# Patient Record
Sex: Female | Born: 1985
Health system: Southern US, Community
[De-identification: ages and names within clinical notes are randomized; demographics above are authoritative.]

## PROBLEM LIST (undated history)

## (undated) DIAGNOSIS — I82409 Acute embolism and thrombosis of unspecified deep veins of unspecified lower extremity: Secondary | ICD-10-CM

## (undated) DIAGNOSIS — D270 Benign neoplasm of right ovary: Secondary | ICD-10-CM

## (undated) DIAGNOSIS — F419 Anxiety disorder, unspecified: Secondary | ICD-10-CM

## (undated) DIAGNOSIS — B009 Herpesviral infection, unspecified: Secondary | ICD-10-CM

## (undated) DIAGNOSIS — D6851 Activated protein C resistance: Secondary | ICD-10-CM

## (undated) DIAGNOSIS — N946 Dysmenorrhea, unspecified: Secondary | ICD-10-CM

## (undated) HISTORY — DX: Anxiety disorder, unspecified: F41.9

## (undated) HISTORY — DX: Activated protein C resistance: D68.51

## (undated) HISTORY — DX: Dysmenorrhea, unspecified: N94.6

## (undated) HISTORY — DX: Acute embolism and thrombosis of unspecified deep veins of unspecified lower extremity: I82.409

## (undated) HISTORY — DX: Herpesviral infection, unspecified: B00.9

## (undated) HISTORY — DX: Benign neoplasm of right ovary: D27.0

---
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
## 2007-09-11 ENCOUNTER — Encounter: Admission: RE | Admit: 2007-09-11 | Discharge: 2007-09-11 | Payer: Self-pay | Admitting: *Deleted

## 2007-12-11 ENCOUNTER — Other Ambulatory Visit: Admission: RE | Admit: 2007-12-11 | Discharge: 2007-12-11 | Payer: Self-pay | Admitting: Gynecology

## 2008-01-10 ENCOUNTER — Encounter: Admission: RE | Admit: 2008-01-10 | Discharge: 2008-01-10 | Payer: Self-pay | Admitting: Surgery

## 2008-08-11 ENCOUNTER — Ambulatory Visit: Payer: Self-pay | Admitting: Oncology

## 2008-08-25 LAB — CBC WITH DIFFERENTIAL/PLATELET
BASO%: 0.6 % (ref 0.0–2.0)
EOS%: 1.2 % (ref 0.0–7.0)
LYMPH%: 33.3 % (ref 14.0–48.0)
MCH: 30.7 pg (ref 26.0–34.0)
MCHC: 34.9 g/dL (ref 32.0–36.0)
MCV: 88.1 fL (ref 81.0–101.0)
MONO#: 0.4 10*3/uL (ref 0.1–0.9)
MONO%: 5.6 % (ref 0.0–13.0)
NEUT%: 59.3 % (ref 39.6–76.8)
Platelets: 260 10*3/uL (ref 145–400)
RBC: 4.57 10*6/uL (ref 3.70–5.32)
WBC: 7.8 10*3/uL (ref 3.9–10.0)

## 2008-08-25 LAB — URINALYSIS, MICROSCOPIC - CHCC
Bilirubin (Urine): NEGATIVE
Ketones: NEGATIVE mg/dL
Specific Gravity, Urine: 1.015 (ref 1.003–1.035)

## 2008-08-25 LAB — MORPHOLOGY

## 2008-09-03 LAB — COMPREHENSIVE METABOLIC PANEL
Albumin: 4.7 g/dL (ref 3.5–5.2)
BUN: 13 mg/dL (ref 6–23)
CO2: 20 mEq/L (ref 19–32)
Calcium: 9.7 mg/dL (ref 8.4–10.5)
Chloride: 106 mEq/L (ref 96–112)
Creatinine, Ser: 0.66 mg/dL (ref 0.40–1.20)
Potassium: 4 mEq/L (ref 3.5–5.3)

## 2008-09-03 LAB — LACTATE DEHYDROGENASE: LDH: 117 U/L (ref 94–250)

## 2008-09-03 LAB — COLD AGGLUTININS: Cold Agglutinin Titer: <1:32 {titer}

## 2008-09-03 LAB — ANA: Anti Nuclear Antibody(ANA): NEGATIVE

## 2008-09-03 LAB — ANTI-DNA ANTIBODY, DOUBLE-STRANDED: ds DNA Ab: 1 IU/mL (ref <5)

## 2008-09-03 LAB — DIRECT ANTIGLOBULIN TEST (NOT AT ARMC): DAT (Complement): NEGATIVE

## 2009-03-01 ENCOUNTER — Ambulatory Visit (HOSPITAL_BASED_OUTPATIENT_CLINIC_OR_DEPARTMENT_OTHER): Admission: RE | Admit: 2009-03-01 | Discharge: 2009-03-02 | Payer: Self-pay | Admitting: Orthopedic Surgery

## 2009-03-03 ENCOUNTER — Ambulatory Visit: Payer: Self-pay | Admitting: Oncology

## 2009-03-03 LAB — PROTIME-INR: Protime: 14.4 Seconds — ABNORMAL HIGH (ref 10.6–13.4)

## 2009-03-05 LAB — PROTIME-INR
INR: 1.4 — ABNORMAL LOW (ref 2.00–3.50)
Protime: 16.8 Seconds — ABNORMAL HIGH (ref 10.6–13.4)

## 2009-03-09 LAB — PROTIME-INR: Protime: 16.8 Seconds — ABNORMAL HIGH (ref 10.6–13.4)

## 2009-03-16 LAB — PROTIME-INR: INR: 1.8 — ABNORMAL LOW (ref 2.00–3.50)

## 2009-04-09 ENCOUNTER — Ambulatory Visit: Payer: Self-pay | Admitting: Oncology

## 2009-07-31 HISTORY — PX: OTHER SURGICAL HISTORY: SHX169

## 2010-07-31 HISTORY — PX: WISDOM TOOTH EXTRACTION: SHX21

## 2010-11-05 LAB — POCT HEMOGLOBIN-HEMACUE: Hemoglobin: 14.6 g/dL (ref 12.0–15.0)

## 2010-12-13 NOTE — Op Note (Signed)
Julie Doyle, Julie Doyle              ACCOUNT NO.:  [id number]   MEDICAL RECORD NO.:  [id number]          PATIENT TYPE:  AMB   LOCATION:  DSC                          FACILITY:  [facility]   PHYSICIAN:  Robert A. Thurston Hole, M.D. DATE OF BIRTH:  25-Sep-1985   DATE OF PROCEDURE:  03/01/2009  DATE OF DISCHARGE:                               OPERATIVE REPORT   PREOPERATIVE DIAGNOSES:  1. Right ankle fibular fracture.  2. Right ankle syndesmosis disruption.   POSTOPERATIVE DIAGNOSES:  1. Right ankle fibular fracture.  2. Right ankle syndesmosis disruption.   PROCEDURE:  1. Open reduction and internal fixation of right ankle distal fibular      fracture.  2. Right ankle syndesmosis fixation.   SURGEON:  Elana Alm. Thurston Hole, MD   ASSISTANT:  Julien Girt, PA   ANESTHESIA:  General.   OPERATIVE TIME:  1 hour and 15 minutes.   COMPLICATIONS:  None.   INDICATIONS FOR PROCEDURE:  Julie Doyle is a 25 year old who sustained a  twisting torquing injury to her right ankle approximately 1 week ago  where x-rays and  examination have revealed a displaced distal fibular  fracture with syndesmosis disruption.  She is now to undergo ORIF of  this.  Of note is that she has a factor V Leiden deficiency and she has  been on preoperative Lovenox and will be treated postoperatively with  Lovenox and Coumadin as well.   DESCRIPTION:  Julie Doyle was brought to the operating room on March 01, 2009, placed on the operative table in supine position.  After an  adequate level of general anesthesia was obtained, her right foot and  leg was prepped using sterile DuraPrep and draped using sterile  technique.  She received Ancef 1 g IV preoperatively for prophylaxis.  The right foot and leg was prepped using sterile DuraPrep and draped  using sterile technique.  Foot and leg were exsanguinated and a calf  tourniquet elevated to 250 mm.  Initially through a 5-cm longitudinal  incision based over the distal fibula,  initial exposure was made.  The  underlying subcutaneous tissues were incised along with skin incision.  The peroneal tendons and sural nerve were carefully protected while the  fracture was exposed.  Hematoma was removed from around the fracture  site and the fracture was reduced in an anatomic position and then an  anterior to posterior 3.5 mm lag screw was placed using standard AO  technique.  This provisionally fixed the fracture in an anatomically  reduced position.  At this point, a 6-hole one-third tubular plate was  placed on the lateral surface.  The 2 most distal screw holes drilled,  measured, and tapped and appropriate length 4.0 mm screws placed and the  2 most proximal screw holes drilled, measured, and tapped and the  appropriate length 3.5-mm cortical screws placed.  After this was done,  then intraoperative fluoroscopy confirmed that there was still some  widening of the syndesmosis and thus a TightRope syndesmosis fixation  apparatus was used through the fibular plate across the syndesmosis.  The syndesmosis was held in a reduced and in  anatomic position with the  ankle in neutral while the TightRope was then tightened and this gave  excellent fixation.  At this point, it was felt that all pathology had  been satisfactorily addressed.  AP and lateral fluoroscopic x-rays  confirmed anatomic reduction of the fracture, anatomic position of the  hardware, and anatomic reduction of the syndesmosis.  The wound was then  irrigated and closed with 2-0 Vicryl to close the fascia over the  fibular plate.  Subcutaneous tissues were closed with 2-0 Vicryl and  subcuticular layer closed with 4-0 Monocryl.  Sterile dressings and a  short-leg splint applied.  Tourniquet was released and the patient  awakened, extubated, and taken to recovery room in stable condition.  Needle and sponge counts correct x2 at the end of the case.   FOLLOWUP CARE:  Julie Doyle will be followed overnight in the  [facility] for IV pain control and neurovascular monitoring.  She will start  on her Coumadin and then back on Coumadin tonight and Lovenox tomorrow.  She will be seen back in the office in a week for wound check and follow  up.      Robert A. Thurston Hole, M.D.  Electronically Signed     RAW/MEDQ  D:  03/01/2009  T:  03/02/2009  Job:  [id number]

## 2012-11-12 ENCOUNTER — Ambulatory Visit (INDEPENDENT_AMBULATORY_CARE_PROVIDER_SITE_OTHER): Payer: BC Managed Care – PPO | Admitting: Internal Medicine

## 2012-11-12 ENCOUNTER — Encounter: Payer: Self-pay | Admitting: Internal Medicine

## 2012-11-12 VITALS — BP 124/76 | HR 78 | Temp 99.0°F | Resp 18 | Wt 196.0 lb

## 2012-11-12 DIAGNOSIS — F329 Major depressive disorder, single episode, unspecified: Secondary | ICD-10-CM | POA: Insufficient documentation

## 2012-11-12 DIAGNOSIS — F341 Dysthymic disorder: Secondary | ICD-10-CM

## 2012-11-12 DIAGNOSIS — N946 Dysmenorrhea, unspecified: Secondary | ICD-10-CM | POA: Insufficient documentation

## 2012-11-12 DIAGNOSIS — S0300XA Dislocation of jaw, unspecified side, initial encounter: Secondary | ICD-10-CM | POA: Insufficient documentation

## 2012-11-12 DIAGNOSIS — F411 Generalized anxiety disorder: Secondary | ICD-10-CM | POA: Insufficient documentation

## 2012-11-12 DIAGNOSIS — D6859 Other primary thrombophilia: Secondary | ICD-10-CM

## 2012-11-12 DIAGNOSIS — G4700 Insomnia, unspecified: Secondary | ICD-10-CM

## 2012-11-12 DIAGNOSIS — D6851 Activated protein C resistance: Secondary | ICD-10-CM | POA: Insufficient documentation

## 2012-11-12 LAB — CBC WITH DIFFERENTIAL/PLATELET
Basophils Relative: 1 % (ref 0–1)
HCT: 39.7 % (ref 36.0–46.0)
Hemoglobin: 13.6 g/dL (ref 12.0–15.0)
Lymphs Abs: 2.9 10*3/uL (ref 0.7–4.0)
MCH: 29.4 pg (ref 26.0–34.0)
MCHC: 34.3 g/dL (ref 30.0–36.0)
Monocytes Absolute: 0.6 10*3/uL (ref 0.1–1.0)
Monocytes Relative: 6 % (ref 3–12)
Neutro Abs: 5.2 10*3/uL (ref 1.7–7.7)
Neutrophils Relative %: 57 % (ref 43–77)
RBC: 4.63 MIL/uL (ref 3.87–5.11)

## 2012-11-12 LAB — LIPID PANEL
Cholesterol: 169 mg/dL (ref 0–200)
LDL Cholesterol: 89 mg/dL (ref 0–99)
Total CHOL/HDL Ratio: 3 Ratio
Triglycerides: 115 mg/dL (ref <150)
VLDL: 23 mg/dL (ref 0–40)

## 2012-11-12 MED ORDER — CLONAZEPAM 1 MG PO TABS: ORAL_TABLET | ORAL | Status: DC

## 2012-11-12 NOTE — Patient Instructions (Addendum)
See me in 4 weeks 

## 2012-11-12 NOTE — Progress Notes (Signed)
Subjective:    Patient ID: Julie Doyle, female    DOB: [birth year], 27 y.o.   MRN: [id number]  HPI Julie Doyle is a new pt here for first visit to establish care.   Currently working and living in [place] until apartment lease expires in August.    PMH of anxiety treated with Clonazepam in the past,  Situational depression treated with Zoloft in the past -( off Zoloft for approx 1.5 years now ),  Factor V leiden,  Dysmenorrhea and ORIF repair of R fibula in 2011.  Julie Doyle also reports she had ADD diagnosed as a child but is very intolerant of stimulants.    Julie Doyle describes an episode 5 days ago where she had L arm pain and some dizzyness awakening her from sleep last Thursday.  She was evaluated at [facility] in [place].  She was concerned she may have blood clot in her L arm and reports the upper arm lultrasound was negative along with V/Q scan and CXR.   She was assured there was no clot.  She denies pain or edema in either arm.  She is also having insomnia.  She is in a relationship with boyfriend that she is trying to end but "he keeps coming around"  She describes almost stalking type behavior and boyfriend has threatened suicide attempts in past.  She knows she is anxious and is having trouble sleeping.  Clonazepam worked well for her anxiety in the past.  She was on Zoloft temporarily when working at [employer] but now has been off anti-depressant for over a year.  She denies S/H ideation,   Does not describe sadness or anhedonia  .  She has no psychotic features.  She has had a therapist off and on but none in [facility]  Allergies  Allergen Reactions  . Ivp Dye (Iodinated Diagnostic Agents)    History reviewed. No pertinent past medical history. History reviewed. No pertinent past surgical history. History   Social History  . Marital Status: Single    Spouse Name: N/A    Number of Children: N/A  . Years of Education: N/A   Occupational History  . Not on file.   Social History  Main Topics  . Smoking status: Not on file  . Smokeless tobacco: Not on file  . Alcohol Use: Not on file  . Drug Use: Not on file  . Sexually Active: Not on file   Other Topics Concern  . Not on file   Social History Narrative  . No narrative on file   History reviewed. No pertinent family history. Patient Active Problem List  Diagnosis  . Factor 5 Leiden mutation, heterozygous   No current outpatient prescriptions on file prior to visit.   No current facility-administered medications on file prior to visit.      Review of Systems See HPI    Objective:   Physical Exam Physical Exam  Nursing note and vitals reviewed.  Constitutional: She is oriented to person, place, and time. She appears well-developed and well-nourished.  HENT:  Head: Normocephalic and atraumatic.  Cardiovascular: Normal rate and regular rhythm. Exam reveals no gallop and no friction rub.  No murmur heard.  Pulmonary/Chest: Breath sounds normal. She has no wheezes. She has no rales.  Neurological: She is alert and oriented to person, place, and time.  Skin: Skin is warm and dry.  Psychiatric: She has a normal mood and affect. Her behavior is normal.  Assessment & Plan:  Situational anxiety  Will give low dose Clonazepam as this has worked well in thepast   She is to take 1/2 or one whole pill at night and 1/2 in daytime 12 hours later if needed.    Check TSH and labs today.  She is contemplating moving to [place] later in summer and I recommended therapist if she resides here.    History of Factor V leiden  dysmennorrhea  NSAID of choice prior to menses  L arm pain  Will get records from [facility]  History of childhood ADD  See me in 4 weeks or sooner as needed

## 2012-11-14 ENCOUNTER — Encounter: Payer: Self-pay | Admitting: *Deleted

## 2012-11-20 ENCOUNTER — Telehealth: Payer: Self-pay | Admitting: *Deleted

## 2012-11-20 NOTE — Telephone Encounter (Signed)
Message copied by Mathews Robinsons on Wed Nov 20, 2012  2:19 PM ------      Message from: Zola Button L      Created: Wed Nov 20, 2012  1:19 PM      Regarding: Pt phone call      Contact: [phone]       Pt called and left msg at 11:40 today states she has watery eyes swollen eyes and wants to know what the best course of treatment is for her to try.  Please call her back at [phone]            [facility] ------

## 2012-11-20 NOTE — Telephone Encounter (Signed)
Error

## 2012-11-20 NOTE — Telephone Encounter (Signed)
Advised Pt to use OTC allergy meds and eye drops. Advised pt that if sx worsened or she experienced swelling in her face or throat to seek emergency care

## 2012-12-12 ENCOUNTER — Telehealth: Payer: Self-pay | Admitting: *Deleted

## 2012-12-12 ENCOUNTER — Ambulatory Visit (INDEPENDENT_AMBULATORY_CARE_PROVIDER_SITE_OTHER): Payer: BC Managed Care – PPO | Admitting: Internal Medicine

## 2012-12-12 ENCOUNTER — Encounter: Payer: Self-pay | Admitting: Internal Medicine

## 2012-12-12 VITALS — BP 113/70 | HR 74 | Temp 98.7°F | Ht 65.25 in | Wt 202.0 lb

## 2012-12-12 DIAGNOSIS — D6851 Activated protein C resistance: Secondary | ICD-10-CM

## 2012-12-12 DIAGNOSIS — N946 Dysmenorrhea, unspecified: Secondary | ICD-10-CM

## 2012-12-12 DIAGNOSIS — F411 Generalized anxiety disorder: Secondary | ICD-10-CM

## 2012-12-12 DIAGNOSIS — D6859 Other primary thrombophilia: Secondary | ICD-10-CM

## 2012-12-12 MED ORDER — LORAZEPAM 0.5 MG PO TABS: ORAL_TABLET | ORAL | Status: DC

## 2012-12-12 MED ORDER — DIFLUNISAL 500 MG PO TABS: 500.0000 mg | ORAL_TABLET | Freq: Two times a day (BID) | ORAL | Status: DC

## 2012-12-12 MED ORDER — NYSTATIN-TRIAMCINOLONE 100000-0.1 UNIT/GM-% EX OINT: TOPICAL_OINTMENT | Freq: Two times a day (BID) | CUTANEOUS | Status: DC

## 2012-12-12 NOTE — Progress Notes (Signed)
Here for follow up. Reports she had a dream that she couldn't breathe and woke up gasping for breathe- states chest was sore for a week.

## 2012-12-12 NOTE — Progress Notes (Signed)
Subjective:    Patient ID: Julie Doyle, female    DOB: 1986-02-11, 27 y.o.   MRN: [id number]  HPI Julie Doyle is here for follow up.  She states her Klonopin is very helpful but tends to make her drowsy. Only uses occasionally at HS.    Former boyfriend bothering her less.  She changed her ringtone on her phone and this helps with her anxiety.  She does describe a bad dream she had 3-4 weeks ago and woke up breathing hard.   She had 1-2 days of chest discomfort around her ribs after that.  All symptoms resolved now.    She has longstanding dysmenorrhea.  Had tried Mirena in the past but was removed after 2-3 months due to pain.  Her GYN is [facility].   She has history of Factor V Leiden and does not wish to try OC"S  She has lots of itching under her breast .  She does wear a tight sports bra on occasion  Allergies  Allergen Reactions  . Ivp Dye (Iodinated Diagnostic Agents)    Past Medical History  Diagnosis Date  . Factor 5 Leiden mutation, heterozygous    Past Surgical History  Procedure Laterality Date  . Appendectomy    . Orif of right fib     History   Social History  . Marital Status: Single    Spouse Name: N/A    Number of Children: N/A  . Years of Education: N/A   Occupational History  . Not on file.   Social History Main Topics  . Smoking status: Never Smoker   . Smokeless tobacco: Never Used  . Alcohol Use: Yes     Comment: socially   . Drug Use: No  . Sexually Active: Yes    Birth Control/ Protection: Condom   Other Topics Concern  . Not on file   Social History Narrative  . No narrative on file   Family History  Problem Relation Age of Onset  . Hypertension Mother   . Cancer Maternal Grandmother     pancreatic  . Diabetes Maternal Grandmother   . Cancer Paternal Grandmother    Patient Active Problem List   Diagnosis Date Noted  . Anxiety state, unspecified 11/12/2012  . Reactive depression (situational) 11/12/2012  . Factor V Leiden  11/12/2012  . Dysmenorrhea 11/12/2012  . Insomnia 11/12/2012  . TMJ (dislocation of temporomandibular joint) 11/12/2012  . Factor 5 Leiden mutation, heterozygous    No current outpatient prescriptions on file prior to visit.   No current facility-administered medications on file prior to visit.       Review of Systems    see HPI Objective:   Physical Exam  Physical Exam  Nursing note and vitals reviewed.  Constitutional: She is oriented to person, place, and time. She appears well-developed and well-nourished.  HENT:  Head: Normocephalic and atraumatic.  Cardiovascular: Normal rate and regular rhythm. Exam reveals no gallop and no friction rub.  No murmur heard.  Pulmonary/Chest: Breath sounds normal. She has no wheezes. She has no rales.  Breast:  She has reddened rash under each breast Neurological: She is alert and oriented to person, place, and time.  Skin: Skin is warm and dry.  Psychiatric: She has a normal mood and affect. Her behavior is normal.            Assessment & Plan:  Anxiety  Will D/C Klonopin and change to shorter acting Ativan  1/2 tab during day and may  repeat 1/2 table 2-3 times per week at hs.    Dysmenorrhea:  Will try Dolobid 500 bid.   Unable to tolerate Mirena.   Advised pt to call [facility] for further evaluation and treatment  Tinea  OK for nystatin TAC creme  See me in 2 months or sooner prn

## 2012-12-12 NOTE — Patient Instructions (Addendum)
See me in 2 months 

## 2012-12-12 NOTE — Telephone Encounter (Signed)
Received a call from pharmacy re: mycolog cream . States price has gone up dramatically to $120 for combination cream- is cheaper to patient to provide 2 separate creams- nystatin and triaminocolone cream if ok with MD. Veirified with Dr. Constance Goltz - may fill individually in 2 creams.

## 2012-12-13 ENCOUNTER — Telehealth: Payer: Self-pay | Admitting: *Deleted

## 2012-12-13 NOTE — Telephone Encounter (Signed)
Returned pts call regarding medication questions LVM will attempt again before leaving office

## 2012-12-13 NOTE — Telephone Encounter (Signed)
Pt had a question regarding her dolobid pt states that after taking medication she began having swelling in her ankles and worsening cramps. Advised pt stop medication and resume taking Ibuprofen. Pt requesting hydrocodone

## 2012-12-16 ENCOUNTER — Telehealth: Payer: Self-pay | Admitting: *Deleted

## 2012-12-16 ENCOUNTER — Telehealth: Payer: Self-pay | Admitting: Internal Medicine

## 2012-12-16 DIAGNOSIS — R102 Pelvic and perineal pain: Secondary | ICD-10-CM

## 2012-12-16 NOTE — Telephone Encounter (Signed)
Spoke with pt.  She reports ankles were swelling when taking Dolobid.  And she continued to have severe pelvic pain  Will get pelvic ultrasound and see pt in my office in am. She voices understanding.  I counseled her again to make appt with her GYN at [facility]

## 2012-12-16 NOTE — Telephone Encounter (Signed)
Made pt aware of appt for [place] at 9:30 on 5/20 as well as begin drinking 32 oz of water one hour prior to procedure

## 2012-12-17 ENCOUNTER — Encounter: Payer: Self-pay | Admitting: Internal Medicine

## 2012-12-17 ENCOUNTER — Ambulatory Visit (HOSPITAL_BASED_OUTPATIENT_CLINIC_OR_DEPARTMENT_OTHER)
Admission: RE | Admit: 2012-12-17 | Discharge: 2012-12-17 | Disposition: A | Discharge status: Home / Self Care | Payer: BC Managed Care – PPO | Source: Ambulatory Visit | Attending: Internal Medicine | Admitting: Internal Medicine

## 2012-12-17 ENCOUNTER — Telehealth: Payer: Self-pay | Admitting: Internal Medicine

## 2012-12-17 ENCOUNTER — Ambulatory Visit (INDEPENDENT_AMBULATORY_CARE_PROVIDER_SITE_OTHER): Payer: BC Managed Care – PPO | Admitting: Internal Medicine

## 2012-12-17 VITALS — BP 110/67 | HR 72 | Temp 98.6°F | Resp 18 | Wt 198.0 lb

## 2012-12-17 DIAGNOSIS — N946 Dysmenorrhea, unspecified: Secondary | ICD-10-CM | POA: Insufficient documentation

## 2012-12-17 DIAGNOSIS — R102 Pelvic and perineal pain: Secondary | ICD-10-CM

## 2012-12-17 DIAGNOSIS — N949 Unspecified condition associated with female genital organs and menstrual cycle: Secondary | ICD-10-CM

## 2012-12-17 DIAGNOSIS — N9489 Other specified conditions associated with female genital organs and menstrual cycle: Secondary | ICD-10-CM

## 2012-12-17 DIAGNOSIS — D279 Benign neoplasm of unspecified ovary: Secondary | ICD-10-CM | POA: Insufficient documentation

## 2012-12-17 DIAGNOSIS — D6859 Other primary thrombophilia: Secondary | ICD-10-CM | POA: Insufficient documentation

## 2012-12-17 DIAGNOSIS — N83209 Unspecified ovarian cyst, unspecified side: Secondary | ICD-10-CM | POA: Insufficient documentation

## 2012-12-17 MED ORDER — HYDROCODONE-ACETAMINOPHEN 5-325 MG PO TABS: ORAL_TABLET | ORAL | Status: DC

## 2012-12-17 NOTE — Telephone Encounter (Signed)
Pt scheduled with Dr Farrel Gobble Friday Dec 27, 2012 at 12:15 pm.  Pt made aware of appt, date, time & location by cell phone voicemail.Julie Doyle

## 2012-12-17 NOTE — Patient Instructions (Addendum)
Will refer to GYN  Take pain med as needed

## 2012-12-17 NOTE — Progress Notes (Signed)
Subjective:    Patient ID: Julie Doyle, female    DOB: 25-May-1986, 25 y.o.   MRN: [id number]  HPI Julie Doyle is here for follow up of pelvic pain and dysmenorrhea.  See ultrasound.  She has approx 6 cm adnexal cyst with possible solid component on imaging.   Dysmenorrhea long-standing.  She was unable to tolerate Mirena she has had in the past.  Mother has Factor V Leiden and pt is too fearful of OC's  Pain is more concentrated on her R side -intermittant.    Allergies  Allergen Reactions  . Ivp Dye (Iodinated Diagnostic Agents)    Past Medical History  Diagnosis Date  . Factor 5 Leiden mutation, heterozygous    Past Surgical History  Procedure Laterality Date  . Appendectomy    . Orif of right fib     History   Social History  . Marital Status: Single    Spouse Name: N/A    Number of Children: N/A  . Years of Education: N/A   Occupational History  . Not on file.   Social History Main Topics  . Smoking status: Never Smoker   . Smokeless tobacco: Never Used  . Alcohol Use: Yes     Comment: socially   . Drug Use: No  . Sexually Active: Yes    Birth Control/ Protection: Condom   Other Topics Concern  . Not on file   Social History Narrative  . No narrative on file   Family History  Problem Relation Age of Onset  . Hypertension Mother   . Cancer Maternal Grandmother     pancreatic  . Diabetes Maternal Grandmother   . Cancer Paternal Grandmother    Patient Active Problem List   Diagnosis Date Noted  . Adnexal mass 12/17/2012  . Anxiety state, unspecified 11/12/2012  . Reactive depression (situational) 11/12/2012  . Factor V Leiden 11/12/2012  . Dysmenorrhea 11/12/2012  . Insomnia 11/12/2012  . TMJ (dislocation of temporomandibular joint) 11/12/2012  . Factor 5 Leiden mutation, heterozygous    Current Outpatient Prescriptions on File Prior to Visit  Medication Sig Dispense Refill  . LORazepam (ATIVAN) 0.5 MG tablet Take 1/2 tablet at HS 2-3 times per  week.  May use 1/2 tablet during the day if needed  30 tablet  1  . Multiple Vitamins-Minerals (MULTIVITAMIN PO) Take 1 capsule by mouth daily. Should take 4 /day , only takes one      . nystatin-triamcinolone ointment (MYCOLOG) Apply topically 2 (two) times daily.  30 g  0  . Omega-3 Fatty Acids (FISH OIL) 1200 MG CAPS Take 2 capsules by mouth daily.      . diflunisal (DOLOBID) 500 MG TABS Take 1 tablet (500 mg total) by mouth 2 (two) times daily.  60 tablet  1   No current facility-administered medications on file prior to visit.       Review of Systems See HPI    Objective:   Physical Exam  Physical Exam  Nursing note and vitals reviewed.  Constitutional: She is oriented to person, place, and time. She appears well-developed and well-nourished.  HENT:  Head: Normocephalic and atraumatic.  Cardiovascular: Normal rate and regular rhythm. Exam reveals no gallop and no friction rub.  No murmur heard.  Pulmonary/Chest: Breath sounds normal. She has no wheezes. She has no rales. Abd:  BS +  No HSM  Tenderness to palpation both lower quadrants  Neurological: She is alert and oriented to person, place, and time.  Skin:  Skin is warm and dry.  Psychiatric: She has a normal mood and affect. Her behavior is normal.        Assessment & Plan:  R adnexal mass  Will refer to GYN for further evaluation  Pelvic pain/dysmennorrhea  Ok for short course of low dose Vicodin until GYN opinion regarding options.  #6 tabs given  FH of Factor V Leiden in mother:  Pt has never been tested herself.  She wishes to check insurance

## 2012-12-24 ENCOUNTER — Other Ambulatory Visit: Payer: Self-pay | Admitting: Internal Medicine

## 2012-12-24 ENCOUNTER — Telehealth: Payer: Self-pay | Admitting: *Deleted

## 2012-12-24 MED ORDER — HYDROCODONE-ACETAMINOPHEN 5-325 MG PO TABS: ORAL_TABLET | ORAL | Status: DC

## 2012-12-24 NOTE — Telephone Encounter (Signed)
Pt will come in to pick up RX

## 2012-12-24 NOTE — Telephone Encounter (Signed)
Pt has an appt on Friday to see the GYN pt reports that she is having a lot of pain and wants to know if Dr Constance Goltz has any suggestions until she sees the GYN  (pt reports that the Vicodin helps)

## 2012-12-24 NOTE — Telephone Encounter (Signed)
Ok for # 10 until her appt

## 2012-12-27 ENCOUNTER — Ambulatory Visit (INDEPENDENT_AMBULATORY_CARE_PROVIDER_SITE_OTHER): Payer: Self-pay | Admitting: Gynecology

## 2012-12-27 ENCOUNTER — Encounter: Payer: Self-pay | Admitting: Gynecology

## 2012-12-27 VITALS — Ht 65.5 in | Wt 194.0 lb

## 2012-12-27 DIAGNOSIS — N83209 Unspecified ovarian cyst, unspecified side: Secondary | ICD-10-CM

## 2012-12-27 DIAGNOSIS — N946 Dysmenorrhea, unspecified: Secondary | ICD-10-CM

## 2012-12-27 DIAGNOSIS — D682 Hereditary deficiency of other clotting factors: Secondary | ICD-10-CM

## 2012-12-27 DIAGNOSIS — N949 Unspecified condition associated with female genital organs and menstrual cycle: Secondary | ICD-10-CM

## 2012-12-27 DIAGNOSIS — R102 Pelvic and perineal pain: Secondary | ICD-10-CM

## 2012-12-27 LAB — POCT WET PREP (WET MOUNT): Clue Cells Wet Prep Whiff POC: NEGATIVE

## 2012-12-27 NOTE — Patient Instructions (Signed)
Pelvic rest, no lifting, crunches, squats, remain in [facility] until determine need for OR

## 2012-12-27 NOTE — Progress Notes (Signed)
Subjective:     Patient ID: Julie Doyle, female   DOB: 1986-05-28, 27 y.o.   MRN: [id number]  HPI Comments: Pt referred by PCP for evaluation and management of ovarian cyst diagonosed by u/s 5/20.  Pt is currently sexually active with condoms only, had used Mirena in past but was intolerant, removed 2010.  Pt reports cycles are regular 28d, 4-6d of flow.  LMP was May 14, on time.  Pt has history of severe cramps but reports pain began 5/19.  Pt states that pain has increased since ultrasound, reports sensation of "pop" with improvement then increase in pain. Pt had not been sexually active recently.  Now reports rectal pressure and radiation to back  In addition pt reports severe dysmenorrhea, has used motrin, ultram, dolobid and ponstel, she is heterozyogotic for factor V leiden and is not on ocp.  Pt's mother and grandmother have had blood clots.  Pt has not ever had one, she is interested in alternatives for contraception.  Gynecologic Exam The patient's pertinent negatives include no vaginal discharge. Pertinent negatives include no flank pain.     Review of Systems  Gastrointestinal: Positive for rectal pain.  Genitourinary: Negative for flank pain, vaginal bleeding, vaginal discharge and vaginal pain.       Objective:   Physical Exam  Constitutional: She is oriented to person, place, and time. She appears well-developed and well-nourished.  Cardiovascular: Normal rate and regular rhythm.   Pulmonary/Chest: Effort normal and breath sounds normal.  Abdominal: Soft. Bowel sounds are normal. She exhibits no distension and no mass. There is no tenderness. There is no rebound and no guarding.  Musculoskeletal: Normal range of motion.  Neurological: She is alert and oriented to person, place, and time.   Pelvic: External genitalia:  no lesions              Urethra:  normal appearing urethra with no masses, tenderness or lesions              Bartholins and Skenes: normal      Vagina: normal appearing vagina with normal color , no lesions, white discharge              Cervix: normal appearance              Pap taken: no        Bimanual Exam:  Uterus:  uterus is normal size, shape, consistency and nontender                                      Adnexa: Left:normal  in size, nontender and no masses, right: fullness with tenderness, mass approx 7cm, immobile                                         Assessment:     Complex adnexal mass,  Heterozygous for factor V leiden dysmenorrhea      Plan:     Ultrasound images reviewed thru epic with patient, mass with 2 septations, CFD not done, DDx serous cystadenoma, hemorrhagic cyst, atypical appearance of endometriosis with severe dysmenorrhea. We discussed at length theses options, Ca125 done today, will repeat u/s next week to assess change, consider surgery.  Pt uncomfortable, reviewed signs to call for, activities to avoid, lives in [place] suggest pt remain local until  determination of care-agrees, may use motrin prn, has vicoden from PCP Wet prep c/w yeast-will use otc rx Wii discuss contraception at follow=up Length 57m reviewing records and discussing diagnosis and treatment

## 2012-12-30 ENCOUNTER — Other Ambulatory Visit: Payer: Self-pay | Admitting: Gynecology

## 2012-12-30 ENCOUNTER — Ambulatory Visit (INDEPENDENT_AMBULATORY_CARE_PROVIDER_SITE_OTHER): Payer: BC Managed Care – PPO | Admitting: Gynecology

## 2012-12-30 ENCOUNTER — Other Ambulatory Visit (INDEPENDENT_AMBULATORY_CARE_PROVIDER_SITE_OTHER): Payer: BC Managed Care – PPO

## 2012-12-30 DIAGNOSIS — N838 Other noninflammatory disorders of ovary, fallopian tube and broad ligament: Secondary | ICD-10-CM

## 2012-12-30 DIAGNOSIS — N83209 Unspecified ovarian cyst, unspecified side: Secondary | ICD-10-CM

## 2012-12-30 DIAGNOSIS — R188 Other ascites: Secondary | ICD-10-CM

## 2012-12-30 DIAGNOSIS — N949 Unspecified condition associated with female genital organs and menstrual cycle: Secondary | ICD-10-CM

## 2012-12-30 DIAGNOSIS — R9389 Abnormal findings on diagnostic imaging of other specified body structures: Secondary | ICD-10-CM

## 2012-12-30 DIAGNOSIS — R102 Pelvic and perineal pain: Secondary | ICD-10-CM

## 2012-12-30 DIAGNOSIS — N920 Excessive and frequent menstruation with regular cycle: Secondary | ICD-10-CM

## 2012-12-30 DIAGNOSIS — N839 Noninflammatory disorder of ovary, fallopian tube and broad ligament, unspecified: Secondary | ICD-10-CM

## 2012-12-30 MED ORDER — TRAMADOL HCL ER 100 MG PO TB24: ORAL_TABLET | ORAL | Status: DC

## 2012-12-30 MED ORDER — ZOLPIDEM TARTRATE 5 MG PO TABS: 5.0000 mg | ORAL_TABLET | Freq: Every evening | ORAL | Status: DC | PRN

## 2012-12-30 NOTE — Telephone Encounter (Signed)
Routed to Dr. Lathrop  

## 2012-12-30 NOTE — Telephone Encounter (Signed)
Medical center out pt. Pharm.  Pharmacist called to check to Rx for Tramadol that was prescribed .   Tramadol EXR  100 mg.  Need to ask question regarding dosage

## 2012-12-30 NOTE — Progress Notes (Signed)
Pt presents with mother to discuss and review u/s done today.  Images were compared with images from 5/20.  Since prior images were TA and ours were TV, cyst shape different but appears slightly smaller today, single thin septation only seen, fluid now noted in cul de sac and anterior to uterus.   Pt informed Ca125normal.   Short interval follow-up shows slight improvement of cyst without resolution, recommend repeat scan, continue pelvic rest and avoid strenuous activities, signs and symptoms of rupture reviewed. Discussed treatment of primary dysmenorrhea, if pt ends up in OR will assess for endometriosis.  Pt is heterozygous for Factor V as is her mother who has had several DVT's therefore will not be candidate for COC's.  Discussed reconsidering progestin IUD but placement of  SKYLA instead of Mirena, also Nexplanon, Dpeoprovera and POP reviewed.  They are leaning towards Skyla and we will confirm at f/u u/s. Discussed pain management for the present-pt had used Ultram in past but at 50mg  only, recommend trying at higher dose and extended release form, she agrees, ambien to help sleep, recommend stopping vicodin Length of time spent discussing ovarian cyst, dysmenorrhea and coagulopathy 84m, >50% face to face

## 2012-12-30 NOTE — Patient Instructions (Signed)
Levonorgestrel intrauterine device (IUD) What is this medicine? LEVONORGESTREL IUD (LEE voe nor jes trel) is a contraceptive (birth control) device. It is used to prevent pregnancy and to treat heavy bleeding that occurs during your period. It can be used for up to 5 years. This medicine may be used for other purposes; ask your health care provider or pharmacist if you have questions. What should I tell my health care provider before I take this medicine? They need to know if you have any of these conditions: -abnormal Pap smear -cancer of the breast, uterus, or cervix -diabetes -endometritis -genital or pelvic infection now or in the past -have more than one sexual partner or your partner has more than one partner -heart disease -history of an ectopic or tubal pregnancy -immune system problems -IUD in place -liver disease or tumor -problems with blood clots or take blood-thinners -use intravenous drugs -uterus of unusual shape -vaginal bleeding that has not been explained -an unusual or allergic reaction to levonorgestrel, other hormones, silicone, or polyethylene, medicines, foods, dyes, or preservatives -pregnant or trying to get pregnant -breast-feeding How should I use this medicine? This device is placed inside the uterus by a health care professional. Talk to your pediatrician regarding the use of this medicine in children. Special care may be needed. Overdosage: If you think you have taken too much of this medicine contact a poison control center or emergency room at once. NOTE: This medicine is only for you. Do not share this medicine with others. What if I miss a dose? This does not apply. What may interact with this medicine? Do not take this medicine with any of the following medications: -amprenavir -bosentan -fosamprenavir This medicine may also interact with the following medications: -aprepitant -barbiturate medicines for inducing sleep or treating  seizures -bexarotene -griseofulvin -medicines to treat seizures like carbamazepine, ethotoin, felbamate, oxcarbazepine, phenytoin, topiramate -modafinil -pioglitazone -rifabutin -rifampin -rifapentine -some medicines to treat HIV infection like atazanavir, indinavir, lopinavir, nelfinavir, tipranavir, ritonavir -St. John's wort -warfarin This list may not describe all possible interactions. Give your health care provider a list of all the medicines, herbs, non-prescription drugs, or dietary supplements you use. Also tell them if you smoke, drink alcohol, or use illegal drugs. Some items may interact with your medicine. What should I watch for while using this medicine? Visit your doctor or health care professional for regular check ups. See your doctor if you or your partner has sexual contact with others, becomes HIV positive, or gets a sexual transmitted disease. This product does not protect you against HIV infection (AIDS) or other sexually transmitted diseases. You can check the placement of the IUD yourself by reaching up to the top of your vagina with clean fingers to feel the threads. Do not pull on the threads. It is a good habit to check placement after each menstrual period. Call your doctor right away if you feel more of the IUD than just the threads or if you cannot feel the threads at all. The IUD may come out by itself. You may become pregnant if the device comes out. If you notice that the IUD has come out use a backup birth control method like condoms and call your health care provider. Using tampons will not change the position of the IUD and are okay to use during your period. What side effects may I notice from receiving this medicine? Side effects that you should report to your doctor or health care professional as soon as possible: -allergic reactions   like skin rash, itching or hives, swelling of the face, lips, or tongue -fever, flu-like symptoms -genital sores -high  blood pressure -no menstrual period for 6 weeks during use -pain, swelling, warmth in the leg -pelvic pain or tenderness -severe or sudden headache -signs of pregnancy -stomach cramping -sudden shortness of breath -trouble with balance, talking, or walking -unusual vaginal bleeding, discharge -yellowing of the eyes or skin Side effects that usually do not require medical attention (report to your doctor or health care professional if they continue or are bothersome): -acne -breast pain -change in sex drive or performance -changes in weight -cramping, dizziness, or faintness while the device is being inserted -headache -irregular menstrual bleeding within first 3 to 6 months of use -nausea This list may not describe all possible side effects. Call your doctor for medical advice about side effects. You may report side effects to FDA at 1-800-FDA-1088. Where should I keep my medicine? This does not apply. NOTE: This sheet is a summary. It may not cover all possible information. If you have questions about this medicine, talk to your doctor, pharmacist, or health care provider.  2012, Elsevier/Gold Standard. (08/07/2008 6:39:08 PM)Etonogestrel implant What is this medicine? ETONOGESTREL is a contraceptive (birth control) device. It is used to prevent pregnancy. It can be used for up to 3 years. This medicine may be used for other purposes; ask your health care provider or pharmacist if you have questions. What should I tell my health care provider before I take this medicine? They need to know if you have any of these conditions: -abnormal vaginal bleeding -blood vessel disease or blood clots -cancer of the breast, cervix, or liver -depression -diabetes -gallbladder disease -headaches -heart disease or recent heart attack -high blood pressure -high cholesterol -kidney disease -liver disease -renal disease -seizures -tobacco smoker -an unusual or allergic reaction to  etonogestrel, other hormones, anesthetics or antiseptics, medicines, foods, dyes, or preservatives -pregnant or trying to get pregnant -breast-feeding How should I use this medicine? This device is inserted just under the skin on the inner side of your upper arm by a health care professional. Talk to your pediatrician regarding the use of this medicine in children. Special care may be needed. Overdosage: If you think you've taken too much of this medicine contact a poison control center or emergency room at once. Overdosage: If you think you have taken too much of this medicine contact a poison control center or emergency room at once. NOTE: This medicine is only for you. Do not share this medicine with others. What if I miss a dose? This does not apply. What may interact with this medicine? Do not take this medicine with any of the following medications: -amprenavir -bosentan -fosamprenavir This medicine may also interact with the following medications: -barbiturate medicines for inducing sleep or treating seizures -certain medicines for fungal infections like ketoconazole and itraconazole -griseofulvin -medicines to treat seizures like carbamazepine, felbamate, oxcarbazepine, phenytoin, topiramate -modafinil -phenylbutazone -rifampin -some medicines to treat HIV infection like atazanavir, indinavir, lopinavir, nelfinavir, tipranavir, ritonavir -St. John's wort This list may not describe all possible interactions. Give your health care provider a list of all the medicines, herbs, non-prescription drugs, or dietary supplements you use. Also tell them if you smoke, drink alcohol, or use illegal drugs. Some items may interact with your medicine. What should I watch for while using this medicine? This product does not protect you against HIV infection (AIDS) or other sexually transmitted diseases. You should be able to feel the  implant by pressing your fingertips over the skin where it was  inserted. Tell your doctor if you cannot feel the implant. What side effects may I notice from receiving this medicine? Side effects that you should report to your doctor or health care professional as soon as possible: -allergic reactions like skin rash, itching or hives, swelling of the face, lips, or tongue -breast lumps -changes in vision -confusion, trouble speaking or understanding -dark urine -depressed mood -general ill feeling or flu-like symptoms -light-colored stools -loss of appetite, nausea -right upper belly pain -severe headaches -severe pain, swelling, or tenderness in the abdomen -shortness of breath, chest pain, swelling in a leg -signs of pregnancy -sudden numbness or weakness of the face, arm or leg -trouble walking, dizziness, loss of balance or coordination -unusual vaginal bleeding, discharge -unusually weak or tired -yellowing of the eyes or skin Side effects that usually do not require medical attention (Report these to your doctor or health care professional if they continue or are bothersome.): -acne -breast pain -changes in weight -cough -fever or chills -headache -irregular menstrual bleeding -itching, burning, and vaginal discharge -pain or difficulty passing urine -sore throat This list may not describe all possible side effects. Call your doctor for medical advice about side effects. You may report side effects to FDA at 1-800-FDA-1088. Where should I keep my medicine? This drug is given in a hospital or clinic and will not be stored at home. NOTE: This sheet is a summary. It may not cover all possible information. If you have questions about this medicine, talk to your doctor, pharmacist, or health care provider.  2012, Elsevier/Gold Standard. (04/09/2009 3:54:17 PM)

## 2012-12-30 NOTE — Telephone Encounter (Signed)
Patient went to pick up tramadol ER rx, was too expensive for her. Is there anything we can change this to that would not be as expensive? Pharmacist suggested regular tramadol?? Please advise.

## 2012-12-31 ENCOUNTER — Telehealth: Payer: Self-pay | Admitting: Gynecology

## 2012-12-31 NOTE — Telephone Encounter (Signed)
[facility] asking to change a prescription given

## 2012-12-31 NOTE — Telephone Encounter (Signed)
Pharmacist called to see if medication Tramadol ER could be changed to lower cost generic of Tramadol. States tramadol ER is generic but cost $42.19. Per [employer]. sue

## 2012-12-31 NOTE — Telephone Encounter (Signed)
[facility] [phone])  calling regarding a prescription give 12/30/2012 Please call

## 2012-12-31 NOTE — Telephone Encounter (Signed)
Pt aware to try otc motrin 600 mg with carafate to see if that helps with the pain. Also wanted to know if it was okay for her to try using a heating pad to see if that helps a little. Told pt that would be fine. Pt agreeable.

## 2012-12-31 NOTE — Telephone Encounter (Signed)
Regular tramadol did not work for her, we can try the otc motrin 600 with carafate so she doesn't get GI upset

## 2013-01-01 NOTE — Telephone Encounter (Signed)
[facility] notified of Dr. Farrel Gobble response and patient had already been addressed concerning this.

## 2013-01-01 NOTE — Telephone Encounter (Signed)
?   I did not order branded, and had suggested she can take her motrin if she didn't want to pay for ER

## 2013-01-02 ENCOUNTER — Ambulatory Visit (INDEPENDENT_AMBULATORY_CARE_PROVIDER_SITE_OTHER): Payer: BC Managed Care – PPO | Admitting: Gynecology

## 2013-01-02 ENCOUNTER — Telehealth: Payer: Self-pay | Admitting: Gynecology

## 2013-01-02 VITALS — BP 112/78 | HR 82 | Resp 18 | Wt 193.0 lb

## 2013-01-02 DIAGNOSIS — N949 Unspecified condition associated with female genital organs and menstrual cycle: Secondary | ICD-10-CM

## 2013-01-02 DIAGNOSIS — R102 Pelvic and perineal pain: Secondary | ICD-10-CM

## 2013-01-02 LAB — POCT URINALYSIS DIPSTICK
Bilirubin, UA: NEGATIVE
pH, UA: 5

## 2013-01-02 MED ORDER — HYDROCODONE-ACETAMINOPHEN 7.5-325 MG PO TABS: 1.0000 | ORAL_TABLET | Freq: Four times a day (QID) | ORAL | Status: DC | PRN

## 2013-01-02 NOTE — Progress Notes (Signed)
Subjective:     Patient ID: Julie Doyle, female   DOB: 05/27/1986, 26 y.o.   MRN: [id number]  Pelvic Pain The patient's primary symptoms include pelvic pain. The problem has been gradually worsening. The pain is moderate. The problem affects the right side. Pertinent negatives include no anorexia, back pain, chills, constipation, diarrhea or flank pain. The symptoms are aggravated by activity. She has tried heating pads and NSAIDs for the symptoms. The treatment provided mild relief. She is not sexually active.  This is the 3rd visit in 7 days for this pain, pt feels that she cannot wait 4w for repeat u/s to see if cyst resolves   Review of Systems  Constitutional: Negative for chills.  Gastrointestinal: Negative for diarrhea, constipation and anorexia.  Genitourinary: Positive for pelvic pain. Negative for flank pain.  Musculoskeletal: Negative for back pain.       Objective:   Physical Exam  Constitutional: She appears distressed (mild, laying in fetal poition but able to lie flat and ambuate without difficulty, slowed).  Abdominal: Soft. She exhibits no distension. There is tenderness (right suprapubic area). There is guarding (right lower quadrant).  Genitourinary: Uterus normal. There is no rash on the right labia. There is no rash on the left labia. Uterus is not tender. Cervix exhibits no motion tenderness. Right adnexum displays mass (anterior lateral to uterus, approx 6cm), tenderness and fullness. Left adnexum displays no mass and no tenderness.  Skin: She is not diaphoretic.       Assessment:     Right ovarian cyst confirmed by u/s Heterzygous for factor V leiden Dysmenorrhea-severe     Plan:     Due to pt's discomfort, will proceed to OR  Will refer to Hematology re factor V, pt without history of DVT but mother has. Will place Skyla IUD while in OR Discussed risks and benefits of OR, expectations regarding her dysmenorrhea, pt aware ovary might not be  preserved Questions addressed rx vicoden given      

## 2013-01-02 NOTE — Telephone Encounter (Signed)
Patient c/o ovarian pain not getting any better, appt. Scheduled with Dr. Farrel Gobble for today. cm

## 2013-01-02 NOTE — Telephone Encounter (Signed)
Surgery instructions given to patient and mother and written copy given. Post op appt scheduled and hosp preop sched for tomorrow at 130pm. Patient given a note for work written on RX pad that she will need limited activity until 01-20-13.  See copy scanned.  Patient with history of Factor V mutation.  Dr Farrel Gobble will contact Hem/Onc regarding potential for Lovenox and we will notify patient.  She will notify us on decision for [facility] tomorrow.

## 2013-01-02 NOTE — Telephone Encounter (Signed)
Patient has a question for Dr. Farrel Gobble, no details given

## 2013-01-02 NOTE — Patient Instructions (Signed)
start vicoden 7.5 for pain now and for post-op. Call for increase in pain not relieved by rx, nausea, vomiting

## 2013-01-03 ENCOUNTER — Encounter (HOSPITAL_COMMUNITY)
Admission: RE | Admit: 2013-01-03 | Discharge: 2013-01-03 | Disposition: A | Discharge status: Home / Self Care | Payer: BC Managed Care – PPO | Source: Ambulatory Visit | Attending: Gynecology | Admitting: Gynecology

## 2013-01-03 ENCOUNTER — Encounter (HOSPITAL_COMMUNITY): Payer: Self-pay

## 2013-01-03 ENCOUNTER — Telehealth: Payer: Self-pay | Admitting: *Deleted

## 2013-01-03 ENCOUNTER — Ambulatory Visit: Payer: Self-pay | Admitting: Gynecology

## 2013-01-03 LAB — CBC
HCT: 40.3 % (ref 36.0–46.0)
Hemoglobin: 14.1 g/dL (ref 12.0–15.0)
RBC: 4.73 MIL/uL (ref 3.87–5.11)
WBC: 9.3 10*3/uL (ref 4.0–10.5)

## 2013-01-03 LAB — SURGICAL PCR SCREEN: Staphylococcus aureus: NEGATIVE

## 2013-01-03 NOTE — Telephone Encounter (Signed)
Dr Farrel Gobble spoke with Dr Gaylyn Rong regarding patient's history of Factor V mutation. Per Dr Gaylyn Rong, patient should be on ASA 325 mg po for 1 week post op.  Dr Farrel Gobble will discuss this with patient at hospital on Monday 01-06-13.

## 2013-01-03 NOTE — Patient Instructions (Signed)
Your procedure is scheduled on:01/06/13  Enter through the Main Entrance at :8am Pick up desk phone and dial [phone] and inform us of your arrival.  Please call [phone] if you have any problems the morning of surgery.  Remember: Do not eat or drink after midnight:SUNDAY  You may brush your teeth the morning of surgery  DO NOT wear jewelry, eye make-up, lipstick,body lotion, or dark fingernail polish.  (Polished toes are ok)   If you are to be admitted after surgery, leave suitcase in car until your room has been assigned. Patients discharged on the day of surgery will not be allowed to drive home. Wear loose fitting, comfortable clothes for your ride home.

## 2013-01-06 ENCOUNTER — Encounter (HOSPITAL_COMMUNITY): Payer: Self-pay | Admitting: Anesthesiology

## 2013-01-06 ENCOUNTER — Ambulatory Visit (INDEPENDENT_AMBULATORY_CARE_PROVIDER_SITE_OTHER): Payer: BC Managed Care – PPO | Admitting: Gynecology

## 2013-01-06 ENCOUNTER — Encounter (HOSPITAL_COMMUNITY)
Admission: RE | Disposition: A | Discharge status: Home / Self Care | Payer: Self-pay | Source: Ambulatory Visit | Attending: Gynecology

## 2013-01-06 ENCOUNTER — Encounter (HOSPITAL_COMMUNITY): Payer: Self-pay | Admitting: Pharmacy Technician

## 2013-01-06 ENCOUNTER — Ambulatory Visit (INDEPENDENT_AMBULATORY_CARE_PROVIDER_SITE_OTHER): Payer: BC Managed Care – PPO

## 2013-01-06 ENCOUNTER — Ambulatory Visit (HOSPITAL_COMMUNITY)
Admission: RE | Admit: 2013-01-06 | Discharge: 2013-01-06 | Disposition: A | Discharge status: Home / Self Care | Payer: BC Managed Care – PPO | Source: Ambulatory Visit | Attending: Gynecology | Admitting: Gynecology

## 2013-01-06 DIAGNOSIS — N946 Dysmenorrhea, unspecified: Secondary | ICD-10-CM

## 2013-01-06 DIAGNOSIS — D682 Hereditary deficiency of other clotting factors: Secondary | ICD-10-CM

## 2013-01-06 DIAGNOSIS — R1900 Intra-abdominal and pelvic swelling, mass and lump, unspecified site: Secondary | ICD-10-CM

## 2013-01-06 DIAGNOSIS — N83209 Unspecified ovarian cyst, unspecified side: Secondary | ICD-10-CM

## 2013-01-06 SURGERY — LAPAROSCOPY OPERATIVE
Anesthesia: General

## 2013-01-06 MED ORDER — INDIGOTINDISULFONATE SODIUM 8 MG/ML IJ SOLN
INTRAMUSCULAR | Status: AC
  Filled 2013-01-06: qty 5

## 2013-01-06 MED ORDER — MIDAZOLAM HCL 2 MG/2ML IJ SOLN
INTRAMUSCULAR | Status: AC
  Filled 2013-01-06: qty 2

## 2013-01-06 MED ORDER — LACTATED RINGERS IV SOLN: INTRAVENOUS | Status: DC

## 2013-01-06 MED ORDER — ROCURONIUM BROMIDE 50 MG/5ML IV SOLN
INTRAVENOUS | Status: AC
  Filled 2013-01-06: qty 1

## 2013-01-06 MED ORDER — PROPOFOL 10 MG/ML IV EMUL
INTRAVENOUS | Status: AC
  Filled 2013-01-06: qty 20

## 2013-01-06 MED ORDER — ONDANSETRON HCL 4 MG/2ML IJ SOLN
INTRAMUSCULAR | Status: AC
  Filled 2013-01-06: qty 2

## 2013-01-06 MED ORDER — LIDOCAINE HCL (CARDIAC) 20 MG/ML IV SOLN
INTRAVENOUS | Status: AC
  Filled 2013-01-06: qty 5

## 2013-01-06 MED ORDER — FENTANYL CITRATE 0.05 MG/ML IJ SOLN
INTRAMUSCULAR | Status: AC
  Filled 2013-01-06: qty 5

## 2013-01-06 SURGICAL SUPPLY — 29 items
ADH SKN CLS APL DERMABOND .7 (GAUZE/BANDAGES/DRESSINGS) ×2
BAG SPEC RTRVL LRG 6X4 10 (ENDOMECHANICALS)
BARRIER ADHS 3X4 INTERCEED (GAUZE/BANDAGES/DRESSINGS) IMPLANT
BRR ADH 4X3 ABS CNTRL BYND (GAUZE/BANDAGES/DRESSINGS)
CABLE HIGH FREQUENCY MONO STRZ (ELECTRODE) IMPLANT
DERMABOND ADVANCED (GAUZE/BANDAGES/DRESSINGS) ×1
DERMABOND ADVANCED .7 DNX12 (GAUZE/BANDAGES/DRESSINGS) ×3 IMPLANT
GLOVE BIOGEL M 6.5 STRL (GLOVE) ×4 IMPLANT
GLOVE BIOGEL PI IND STRL 6.5 (GLOVE) ×3 IMPLANT
GLOVE BIOGEL PI INDICATOR 6.5 (GLOVE) ×1
GOWN PREVENTION PLUS LG XLONG (DISPOSABLE) ×12 IMPLANT
NS IRRIG 1000ML POUR BTL (IV SOLUTION) ×4 IMPLANT
PACK LAPAROSCOPY BASIN (CUSTOM PROCEDURE TRAY) ×4 IMPLANT
POUCH SPECIMEN RETRIEVAL 10MM (ENDOMECHANICALS) IMPLANT
PROTECTOR NERVE ULNAR (MISCELLANEOUS) ×4 IMPLANT
SCALPEL HARMONIC ACE (MISCELLANEOUS) IMPLANT
SCISSORS LAP 5X35 DISP (ENDOMECHANICALS) IMPLANT
SET IRRIG TUBING LAPAROSCOPIC (IRRIGATION / IRRIGATOR) IMPLANT
STRIP CLOSURE SKIN 1/4X4 (GAUZE/BANDAGES/DRESSINGS) IMPLANT
SUT MNCRL AB 3-0 PS2 27 (SUTURE) ×4 IMPLANT
SUT VICRYL 0 ENDOLOOP (SUTURE) IMPLANT
SUT VICRYL 0 UR6 27IN ABS (SUTURE) ×4 IMPLANT
SYR 50ML LL SCALE MARK (SYRINGE) IMPLANT
TOWEL OR 17X24 6PK STRL BLUE (TOWEL DISPOSABLE) ×8 IMPLANT
TRAY FOLEY CATH 14FR (SET/KITS/TRAYS/PACK) ×4 IMPLANT
TROCAR XCEL NON-BLD 11X100MML (ENDOMECHANICALS) IMPLANT
TROCAR XCEL NON-BLD 5MMX100MML (ENDOMECHANICALS) IMPLANT
WARMER LAPAROSCOPE (MISCELLANEOUS) ×4 IMPLANT
WATER STERILE IRR 1000ML POUR (IV SOLUTION) ×4 IMPLANT

## 2013-01-06 NOTE — Progress Notes (Signed)
Pt was scheduled for OR this am to remove ovarian cyst, but  reported that her pain had resolved over the weekend and thought that her cyst was resolving.  She had used no pain rx since our OV on Friday.  Pt and mother present now for repeat u/s. Images reviewed with both, cyst remains unchanged. Discussed that it has been 3w since first u/s with essentially no change in appearance of ovarian cyst, recommend we proceed to OR tomorrw. Reviewed risks of bleeding, infection, damage to underlying bowel, bladder and vessels.  Nerve damage reviewed.  Case was discussed with Dr Ha-heme/onc, who recommends that as pt is heterozygous without history of DVT, although mother with pos history, she take regular ASA for 1w post-op, se is aware of s/s of DVT/PE. Skyla IUD will be placed at the time of OR.

## 2013-01-06 NOTE — Progress Notes (Signed)
Patient arrived to [facility] and expressed that she thought she no longer needs to have surgery.  Called Dr. Farrel Gobble and she spoke to patient.  Dr.  Farrel Gobble called back to say she was cancelling surgery for today and that she had instructed patient.

## 2013-01-07 ENCOUNTER — Ambulatory Visit (INDEPENDENT_AMBULATORY_CARE_PROVIDER_SITE_OTHER): Payer: BC Managed Care – PPO | Admitting: Gynecology

## 2013-01-07 ENCOUNTER — Encounter (HOSPITAL_COMMUNITY): Payer: Self-pay | Admitting: Anesthesiology

## 2013-01-07 ENCOUNTER — Encounter (HOSPITAL_COMMUNITY): Payer: Self-pay | Admitting: Pharmacy Technician

## 2013-01-07 ENCOUNTER — Ambulatory Visit (HOSPITAL_COMMUNITY)
Admission: RE | Admit: 2013-01-07 | Discharge: 2013-01-07 | Disposition: A | Discharge status: Home / Self Care | Payer: BC Managed Care – PPO | Source: Ambulatory Visit | Attending: Gynecology | Admitting: Gynecology

## 2013-01-07 ENCOUNTER — Encounter (HOSPITAL_COMMUNITY)
Admission: RE | Disposition: A | Discharge status: Home / Self Care | Payer: Self-pay | Source: Ambulatory Visit | Attending: Gynecology

## 2013-01-07 ENCOUNTER — Ambulatory Visit (HOSPITAL_COMMUNITY): Payer: BC Managed Care – PPO | Admitting: Anesthesiology

## 2013-01-07 VITALS — BP 120/80 | HR 88 | Temp 97.8°F | Resp 12 | Ht 65.0 in

## 2013-01-07 DIAGNOSIS — N946 Dysmenorrhea, unspecified: Secondary | ICD-10-CM

## 2013-01-07 DIAGNOSIS — D6859 Other primary thrombophilia: Secondary | ICD-10-CM | POA: Insufficient documentation

## 2013-01-07 DIAGNOSIS — G8918 Other acute postprocedural pain: Secondary | ICD-10-CM

## 2013-01-07 DIAGNOSIS — Z30430 Encounter for insertion of intrauterine contraceptive device: Secondary | ICD-10-CM

## 2013-01-07 DIAGNOSIS — N949 Unspecified condition associated with female genital organs and menstrual cycle: Secondary | ICD-10-CM | POA: Insufficient documentation

## 2013-01-07 DIAGNOSIS — N83209 Unspecified ovarian cyst, unspecified side: Secondary | ICD-10-CM | POA: Insufficient documentation

## 2013-01-07 HISTORY — PX: LAPAROSCOPY: SHX197

## 2013-01-07 HISTORY — PX: OTHER SURGICAL HISTORY: SHX169

## 2013-01-07 HISTORY — PX: INTRAUTERINE DEVICE (IUD) INSERTION: SHX5877

## 2013-01-07 LAB — PREGNANCY, URINE: Preg Test, Ur: NEGATIVE

## 2013-01-07 SURGERY — LAPAROSCOPY OPERATIVE
Anesthesia: General | Site: Vagina | Laterality: Right | Wound class: Clean Contaminated

## 2013-01-07 MED ORDER — HYDROMORPHONE HCL 2 MG PO TABS
ORAL_TABLET | ORAL | Status: AC
  Administered 2013-01-07: 2 mg via ORAL
  Filled 2013-01-07: qty 1

## 2013-01-07 MED ORDER — ACETAMINOPHEN 10 MG/ML IV SOLN
INTRAVENOUS | Status: AC
  Administered 2013-01-07: 1000 mg via INTRAVENOUS
  Filled 2013-01-07: qty 100

## 2013-01-07 MED ORDER — BUPIVACAINE HCL (PF) 0.25 % IJ SOLN
INTRAMUSCULAR | Status: DC | PRN
  Administered 2013-01-07: 10 mL

## 2013-01-07 MED ORDER — FENTANYL CITRATE 0.05 MG/ML IJ SOLN: 50.0000 ug | Freq: Once | INTRAMUSCULAR | Status: AC

## 2013-01-07 MED ORDER — HYDROMORPHONE HCL PF 1 MG/ML IJ SOLN
INTRAMUSCULAR | Status: DC | PRN
  Administered 2013-01-07 (×2): 1 mg via INTRAVENOUS

## 2013-01-07 MED ORDER — FENTANYL CITRATE 0.05 MG/ML IJ SOLN
INTRAMUSCULAR | Status: AC
  Filled 2013-01-07: qty 2

## 2013-01-07 MED ORDER — MIDAZOLAM HCL 2 MG/2ML IJ SOLN
INTRAMUSCULAR | Status: AC
  Filled 2013-01-07: qty 2

## 2013-01-07 MED ORDER — FENTANYL CITRATE 0.05 MG/ML IJ SOLN
50.0000 ug | Freq: Once | INTRAMUSCULAR | Status: AC
  Administered 2013-01-07: 50 ug via INTRAVENOUS

## 2013-01-07 MED ORDER — LIDOCAINE HCL (CARDIAC) 20 MG/ML IV SOLN
INTRAVENOUS | Status: DC | PRN
  Administered 2013-01-07: 75 mg via INTRAVENOUS

## 2013-01-07 MED ORDER — ROCURONIUM BROMIDE 100 MG/10ML IV SOLN
INTRAVENOUS | Status: DC | PRN
  Administered 2013-01-07 (×2): 10 mg via INTRAVENOUS
  Administered 2013-01-07: 30 mg via INTRAVENOUS

## 2013-01-07 MED ORDER — KETOROLAC TROMETHAMINE 30 MG/ML IJ SOLN
INTRAMUSCULAR | Status: DC | PRN
  Administered 2013-01-07: 30 mg via INTRAVENOUS
  Administered 2013-01-07: 30 mg via INTRAMUSCULAR

## 2013-01-07 MED ORDER — MIDAZOLAM HCL 2 MG/2ML IJ SOLN: 0.5000 mg | Freq: Once | INTRAMUSCULAR | Status: DC | PRN

## 2013-01-07 MED ORDER — MIDAZOLAM HCL 5 MG/5ML IJ SOLN
INTRAMUSCULAR | Status: DC | PRN
  Administered 2013-01-07: 2 mg via INTRAVENOUS

## 2013-01-07 MED ORDER — GLYCOPYRROLATE 0.2 MG/ML IJ SOLN
INTRAMUSCULAR | Status: DC | PRN
  Administered 2013-01-07: .4 mg via INTRAVENOUS

## 2013-01-07 MED ORDER — GLYCOPYRROLATE 0.2 MG/ML IJ SOLN
INTRAMUSCULAR | Status: AC
  Filled 2013-01-07: qty 2

## 2013-01-07 MED ORDER — KETOROLAC TROMETHAMINE 30 MG/ML IJ SOLN
INTRAMUSCULAR | Status: AC
  Filled 2013-01-07: qty 2

## 2013-01-07 MED ORDER — LACTATED RINGERS IV SOLN
INTRAVENOUS | Status: DC
  Administered 2013-01-07 (×2): via INTRAVENOUS

## 2013-01-07 MED ORDER — STERILE WATER FOR IRRIGATION IR SOLN
Status: DC | PRN
  Administered 2013-01-07: 1000 mL

## 2013-01-07 MED ORDER — DEXTROSE IN LACTATED RINGERS 5 % IV SOLN
Freq: Once | INTRAVENOUS | Status: AC
  Administered 2013-01-07: 14:00:00 via INTRAVENOUS

## 2013-01-07 MED ORDER — BUPIVACAINE HCL (PF) 0.25 % IJ SOLN
INTRAMUSCULAR | Status: AC
  Filled 2013-01-07: qty 30

## 2013-01-07 MED ORDER — NEOSTIGMINE METHYLSULFATE 1 MG/ML IJ SOLN
INTRAMUSCULAR | Status: AC
  Filled 2013-01-07: qty 1

## 2013-01-07 MED ORDER — HYDROMORPHONE HCL PF 1 MG/ML IJ SOLN
INTRAMUSCULAR | Status: AC
  Filled 2013-01-07: qty 1

## 2013-01-07 MED ORDER — FENTANYL CITRATE 0.05 MG/ML IJ SOLN
25.0000 ug | INTRAMUSCULAR | Status: DC | PRN
  Administered 2013-01-07: 50 ug via INTRAVENOUS

## 2013-01-07 MED ORDER — ONDANSETRON HCL 4 MG/2ML IJ SOLN
INTRAMUSCULAR | Status: DC | PRN
  Administered 2013-01-07: 4 mg via INTRAVENOUS

## 2013-01-07 MED ORDER — HEPARIN SODIUM (PORCINE) 5000 UNIT/ML IJ SOLN
INTRAMUSCULAR | Status: DC | PRN
  Administered 2013-01-07: 5000 [IU]

## 2013-01-07 MED ORDER — GABAPENTIN 400 MG PO CAPS
ORAL_CAPSULE | ORAL | Status: AC
  Filled 2013-01-07: qty 1

## 2013-01-07 MED ORDER — GABAPENTIN 400 MG PO CAPS
ORAL_CAPSULE | ORAL | Status: AC
  Administered 2013-01-07: 400 mg via ORAL
  Filled 2013-01-07: qty 1

## 2013-01-07 MED ORDER — GABAPENTIN 400 MG PO CAPS: 800.0000 mg | ORAL_CAPSULE | Freq: Once | ORAL | Status: AC

## 2013-01-07 MED ORDER — GABAPENTIN 400 MG PO CAPS
ORAL_CAPSULE | ORAL | Status: AC
  Administered 2013-01-07: 400 mg
  Filled 2013-01-07: qty 1

## 2013-01-07 MED ORDER — DEXAMETHASONE SODIUM PHOSPHATE 4 MG/ML IJ SOLN
INTRAMUSCULAR | Status: DC | PRN
  Administered 2013-01-07: 10 mg via INTRAVENOUS

## 2013-01-07 MED ORDER — PROPOFOL 10 MG/ML IV EMUL
INTRAVENOUS | Status: AC
  Filled 2013-01-07: qty 20

## 2013-01-07 MED ORDER — PROMETHAZINE HCL 25 MG/ML IJ SOLN: 6.2500 mg | INTRAMUSCULAR | Status: DC | PRN

## 2013-01-07 MED ORDER — FENTANYL CITRATE 0.05 MG/ML IJ SOLN
INTRAMUSCULAR | Status: DC | PRN
  Administered 2013-01-07 (×2): 50 ug via INTRAVENOUS
  Administered 2013-01-07: 150 ug via INTRAVENOUS
  Administered 2013-01-07: 100 ug via INTRAVENOUS

## 2013-01-07 MED ORDER — KETOROLAC TROMETHAMINE 30 MG/ML IJ SOLN
INTRAMUSCULAR | Status: AC
  Filled 2013-01-07: qty 1

## 2013-01-07 MED ORDER — KETOROLAC TROMETHAMINE 30 MG/ML IJ SOLN: 15.0000 mg | Freq: Once | INTRAMUSCULAR | Status: DC | PRN

## 2013-01-07 MED ORDER — LIDOCAINE HCL (CARDIAC) 20 MG/ML IV SOLN
INTRAVENOUS | Status: AC
  Filled 2013-01-07: qty 5

## 2013-01-07 MED ORDER — ROCURONIUM BROMIDE 50 MG/5ML IV SOLN
INTRAVENOUS | Status: AC
  Filled 2013-01-07: qty 1

## 2013-01-07 MED ORDER — FENTANYL CITRATE 0.05 MG/ML IJ SOLN
INTRAMUSCULAR | Status: AC
  Administered 2013-01-07: 100 ug via INTRAVENOUS
  Filled 2013-01-07: qty 2

## 2013-01-07 MED ORDER — PROPOFOL 10 MG/ML IV BOLUS
INTRAVENOUS | Status: DC | PRN
  Administered 2013-01-07: 200 mg via INTRAVENOUS

## 2013-01-07 MED ORDER — FENTANYL CITRATE 0.05 MG/ML IJ SOLN
INTRAMUSCULAR | Status: AC
  Administered 2013-01-07: 50 ug via INTRAVENOUS
  Filled 2013-01-07: qty 2

## 2013-01-07 MED ORDER — OXYCODONE-ACETAMINOPHEN 5-325 MG PO TABS: 2.0000 | ORAL_TABLET | ORAL | Status: DC | PRN

## 2013-01-07 MED ORDER — CELECOXIB 200 MG PO CAPS: 400.0000 mg | ORAL_CAPSULE | Freq: Once | ORAL | Status: DC

## 2013-01-07 MED ORDER — MEPERIDINE HCL 25 MG/ML IJ SOLN: 6.2500 mg | INTRAMUSCULAR | Status: DC | PRN

## 2013-01-07 MED ORDER — MEPERIDINE HCL 25 MG/ML IJ SOLN
INTRAMUSCULAR | Status: AC
  Administered 2013-01-07: 12.5 mg via INTRAVENOUS
  Filled 2013-01-07: qty 1

## 2013-01-07 MED ORDER — ROPIVACAINE HCL 5 MG/ML IJ SOLN
INTRAMUSCULAR | Status: DC | PRN
  Administered 2013-01-07: 30 mL

## 2013-01-07 MED ORDER — ONDANSETRON HCL 4 MG/2ML IJ SOLN
INTRAMUSCULAR | Status: AC
  Filled 2013-01-07: qty 2

## 2013-01-07 MED ORDER — ROPIVACAINE HCL 5 MG/ML IJ SOLN
INTRAMUSCULAR | Status: AC
  Filled 2013-01-07: qty 30

## 2013-01-07 MED ORDER — PHENYLEPHRINE 40 MCG/ML (10ML) SYRINGE FOR IV PUSH (FOR BLOOD PRESSURE SUPPORT)
PREFILLED_SYRINGE | INTRAVENOUS | Status: AC
  Filled 2013-01-07: qty 5

## 2013-01-07 MED ORDER — FENTANYL CITRATE 0.05 MG/ML IJ SOLN
INTRAMUSCULAR | Status: AC
  Filled 2013-01-07: qty 5

## 2013-01-07 MED ORDER — SODIUM CHLORIDE 0.9 % IJ SOLN
INTRAMUSCULAR | Status: DC | PRN
  Administered 2013-01-07: 30 mL

## 2013-01-07 MED ORDER — GABAPENTIN 800 MG PO TABS: 800.0000 mg | ORAL_TABLET | Freq: Once | ORAL | Status: DC

## 2013-01-07 MED ORDER — HYDROMORPHONE HCL 2 MG PO TABS: 2.0000 mg | ORAL_TABLET | Freq: Once | ORAL | Status: AC

## 2013-01-07 MED ORDER — DEXAMETHASONE SODIUM PHOSPHATE 10 MG/ML IJ SOLN
INTRAMUSCULAR | Status: AC
  Filled 2013-01-07: qty 1

## 2013-01-07 MED ORDER — DIAZEPAM 5 MG PO TABS
10.0000 mg | ORAL_TABLET | Freq: Once | ORAL | Status: AC
  Administered 2013-01-07: 5 mg via ORAL
  Filled 2013-01-07: qty 2

## 2013-01-07 MED ORDER — GLYCOPYRROLATE 0.2 MG/ML IJ SOLN
INTRAMUSCULAR | Status: AC
  Filled 2013-01-07: qty 3

## 2013-01-07 MED ORDER — NEOSTIGMINE METHYLSULFATE 1 MG/ML IJ SOLN
INTRAMUSCULAR | Status: DC | PRN
  Administered 2013-01-07: 3 mg via INTRAVENOUS

## 2013-01-07 SURGICAL SUPPLY — 31 items
ADH SKN CLS APL DERMABOND .7 (GAUZE/BANDAGES/DRESSINGS) ×3
BAG SPEC RTRVL LRG 6X4 10 (ENDOMECHANICALS)
BARRIER ADHS 3X4 INTERCEED (GAUZE/BANDAGES/DRESSINGS) ×2 IMPLANT
BRR ADH 4X3 ABS CNTRL BYND (GAUZE/BANDAGES/DRESSINGS) ×3
CABLE HIGH FREQUENCY MONO STRZ (ELECTRODE) ×2 IMPLANT
DERMABOND ADVANCED (GAUZE/BANDAGES/DRESSINGS) ×1
DERMABOND ADVANCED .7 DNX12 (GAUZE/BANDAGES/DRESSINGS) ×3 IMPLANT
GLOVE BIOGEL M 6.5 STRL (GLOVE) ×4 IMPLANT
GLOVE BIOGEL PI IND STRL 6.5 (GLOVE) ×3 IMPLANT
GLOVE BIOGEL PI INDICATOR 6.5 (GLOVE) ×1
GOWN PREVENTION PLUS LG XLONG (DISPOSABLE) ×12 IMPLANT
NS IRRIG 1000ML POUR BTL (IV SOLUTION) ×2 IMPLANT
PACK LAPAROSCOPY BASIN (CUSTOM PROCEDURE TRAY) ×4 IMPLANT
POUCH SPECIMEN RETRIEVAL 10MM (ENDOMECHANICALS) IMPLANT
PROTECTOR NERVE ULNAR (MISCELLANEOUS) ×4 IMPLANT
SCALPEL HARMONIC ACE (MISCELLANEOUS) IMPLANT
SCISSORS LAP 5X35 DISP (ENDOMECHANICALS) IMPLANT
SET IRRIG TUBING LAPAROSCOPIC (IRRIGATION / IRRIGATOR) IMPLANT
SPONGE SURGIFOAM ABS GEL 12-7 (HEMOSTASIS) ×2 IMPLANT
STRIP CLOSURE SKIN 1/4X4 (GAUZE/BANDAGES/DRESSINGS) IMPLANT
SUT MNCRL AB 3-0 PS2 27 (SUTURE) ×6 IMPLANT
SUT VICRYL 0 ENDOLOOP (SUTURE) IMPLANT
SUT VICRYL 0 UR6 27IN ABS (SUTURE) ×4 IMPLANT
SYR 50ML LL SCALE MARK (SYRINGE) IMPLANT
TOWEL OR 17X24 6PK STRL BLUE (TOWEL DISPOSABLE) ×8 IMPLANT
TRAY FOLEY CATH 14FR (SET/KITS/TRAYS/PACK) ×4 IMPLANT
TROCAR XCEL NON-BLD 11X100MML (ENDOMECHANICALS) ×2 IMPLANT
TROCAR XCEL NON-BLD 5MMX100MML (ENDOMECHANICALS) ×4 IMPLANT
WARMER LAPAROSCOPE (MISCELLANEOUS) ×4 IMPLANT
WATER STERILE IRR 1000ML POUR (IV SOLUTION) ×4 IMPLANT
skyla ×2 IMPLANT

## 2013-01-07 NOTE — Transfer of Care (Signed)
Immediate Anesthesia Transfer of Care Note  Patient: Julie Doyle  Procedure(s) Performed: Procedure(s) with comments: LAPAROSCOPY OPERATIVE (Right) - ovarian cystectomy INTRAUTERINE DEVICE (IUD) INSERTION (N/A)  Patient Location: PACU  Anesthesia Type:General  Level of Consciousness: awake, alert  and oriented  Airway & Oxygen Therapy: Patient Spontanous Breathing and Patient connected to nasal cannula oxygen  Post-op Assessment: Report given to PACU RN and Post -op Vital signs reviewed and stable  Post vital signs: stable  Complications: No apparent anesthesia complications

## 2013-01-07 NOTE — Brief Op Note (Signed)
01/07/2013  12:49 PM  PATIENT:  Irem E Daughtridge  27 y.o. female  PRE-OPERATIVE DIAGNOSIS:  ovarian cyst CPT CPT [phone]  POST-OPERATIVE DIAGNOSIS:  ovarian cyst  PROCEDURE:  Procedure(s) with comments: LAPAROSCOPY OPERATIVE (Right) - ovarian cystectomy INTRAUTERINE DEVICE (IUD) INSERTION (N/A)  SURGEON:  Surgeon(s) and Role:    * Bennye Alm, MD - Primary    * Annamaria Boots, MD - Assisting    ANESTHESIA:   general  EBL:  Total I/O In: 1000 [I.V.:1000] Out: 275 [Urine:250; Blood:25]  BLOOD ADMINISTERED:none  DRAINS: none   LOCAL MEDICATIONS USED:  MARCAINE     SPECIMEN:  Source of Specimen:  right ovarian cyst wall and Lavage/Washing  DISPOSITION OF SPECIMEN:  PATHOLOGY  COUNTS:  YES  TOURNIQUET:  * No tourniquets in log *  DICTATION: .Other Dictation: Dictation Number [id number]  PLAN OF CARE: Discharge to home after PACU  PATIENT DISPOSITION:  PACU - hemodynamically stable.   Delay start of Pharmacological VTE agent (>24hrs) due to surgical blood loss or risk of bleeding: not applicable

## 2013-01-07 NOTE — Progress Notes (Signed)
Patient ID: Julie Doyle, female   DOB: 1986-03-14, 27 y.o.   MRN: [id number] Pt s/p laparoscopic right ovarina cystecomy with placement of skyla IUD for severe dysmenorrhea this am.  Pt had started her menses this morning as well and has severe, incapacitating cramps in general, these are a bit worse.  Pt reports waves of discomfort Pt had received dilaudid, fentanyl, torodol in PACU but remained uncomfortable, we had asked them to come by office for assessment and rx for percocet.  Pt is uncomfortable, in moderate distress Abdomen:  Mild bruising and distention, incisions intact, guarding Speculum:  IUD strings seen in cervix, min blood from os, no CMT, Uterus difficult to assess  A/P:  Immediate post-op with pain Discussed with Pt and mother, I suspect it is combination of surgery, menses and IUD. No evidence that IUD is malpostioned, pt reports similar effect from Mirena Recommend placing paracervical block and are agreeable Lidocaine jelly placed on ectocervix, 10cc of equal parts 2%lidocaine and 0.25%Marcaine placed in cervx, pt observed for 53m, denies palpitations, metallic taste.  Pain has eased up by the time pt left office. She was given rx for percocet if needed, recommend miralax and simethicone

## 2013-01-07 NOTE — Anesthesia Preprocedure Evaluation (Addendum)
Anesthesia Evaluation  Patient identified by MRN, date of birth, ID band Patient awake    Reviewed: Allergy & Precautions, H&P , Patient's Chart, lab work & pertinent test results, reviewed documented beta blocker date and time   History of Anesthesia Complications Negative for: history of anesthetic complications  Airway Mallampati: II TM Distance: >3 FB Neck ROM: full    Dental no notable dental hx.    Pulmonary neg pulmonary ROS,  breath sounds clear to auscultation  Pulmonary exam normal       Cardiovascular Exercise Tolerance: Good negative cardio ROS  + Valvular Problems/Murmurs Rhythm:regular Rate:Normal     Neuro/Psych PSYCHIATRIC DISORDERS negative neurological ROS  negative psych ROS   GI/Hepatic negative GI ROS, Neg liver ROS,   Endo/Other  negative endocrine ROS  Renal/GU negative Renal ROS     Musculoskeletal   Abdominal   Peds  Hematology negative hematology ROS (+) Blood dyscrasia, ,   Anesthesia Other Findings Factor 5 Leiden mutation, heterozygous     Anxiety        Clotting disorder     Dysmenorrhea        Endometriosis     HSV-1 infection        Blood dyscrasia   factor V   Heart murmur    Reproductive/Obstetrics negative OB ROS                          Anesthesia Physical Anesthesia Plan  ASA: II  Anesthesia Plan: General ETT   Post-op Pain Management:    Induction:   Airway Management Planned:   Additional Equipment:   Intra-op Plan:   Post-operative Plan:   Informed Consent: I have reviewed the patients History and Physical, chart, labs and discussed the procedure including the risks, benefits and alternatives for the proposed anesthesia with the patient or authorized representative who has indicated his/her understanding and acceptance.   Dental Advisory Given  Plan Discussed with: CRNA and Surgeon  Anesthesia Plan Comments:        Anesthesia  Quick Evaluation

## 2013-01-07 NOTE — H&P (View-Only) (Signed)
Subjective:     Patient ID: Julie Doyle, female   DOB: 12/10/1985, 27 y.o.   MRN: [id number]  Pelvic Pain The patient's primary symptoms include pelvic pain. The problem has been gradually worsening. The pain is moderate. The problem affects the right side. Pertinent negatives include no anorexia, back pain, chills, constipation, diarrhea or flank pain. The symptoms are aggravated by activity. She has tried heating pads and NSAIDs for the symptoms. The treatment provided mild relief. She is not sexually active.  This is the 3rd visit in 7 days for this pain, pt feels that she cannot wait 4w for repeat u/s to see if cyst resolves   Review of Systems  Constitutional: Negative for chills.  Gastrointestinal: Negative for diarrhea, constipation and anorexia.  Genitourinary: Positive for pelvic pain. Negative for flank pain.  Musculoskeletal: Negative for back pain.       Objective:   Physical Exam  Constitutional: She appears distressed (mild, laying in fetal poition but able to lie flat and ambuate without difficulty, slowed).  Abdominal: Soft. She exhibits no distension. There is tenderness (right suprapubic area). There is guarding (right lower quadrant).  Genitourinary: Uterus normal. There is no rash on the right labia. There is no rash on the left labia. Uterus is not tender. Cervix exhibits no motion tenderness. Right adnexum displays mass (anterior lateral to uterus, approx 6cm), tenderness and fullness. Left adnexum displays no mass and no tenderness.  Skin: She is not diaphoretic.       Assessment:     Right ovarian cyst confirmed by u/s Heterzygous for factor V leiden Dysmenorrhea-severe     Plan:     Due to pt's discomfort, will proceed to OR  Will refer to Hematology re factor V, pt without history of DVT but mother has. Will place Skyla IUD while in OR Discussed risks and benefits of OR, expectations regarding her dysmenorrhea, pt aware ovary might not be  preserved Questions addressed rx vicoden given

## 2013-01-07 NOTE — Interval H&P Note (Signed)
History and Physical Interval Note:  01/07/2013 10:45 AM  Julie Doyle  has presented today for surgery, with the diagnosis of ovarian cyst CPT CPT [phone]  The various methods of treatment have been discussed with the patient and family. After consideration of risks, benefits and other options for treatment, the patient has consented to  Procedure(s) with comments: LAPAROSCOPY OPERATIVE (N/A) - ovarian cystectomy SALPINGO OOPHORECTOMY (Right) - Skyla insert/ Dr Farrel Gobble will bring IUD INTRAUTERINE DEVICE (IUD) INSERTION (N/A) as a surgical intervention .  The patient's history has been reviewed, patient examined, no change in status, stable for surgery.  I have reviewed the patient's chart and labs.  Questions were answered to the patient's satisfaction.     Shaterra Sanzone H

## 2013-01-07 NOTE — Anesthesia Postprocedure Evaluation (Signed)
Anesthesia Post Note  Patient: Julie Doyle  Procedure(s) Performed: Procedure(s) (LRB): LAPAROSCOPY OPERATIVE (Right) INTRAUTERINE DEVICE (IUD) INSERTION (N/A)  Anesthesia type: General  Patient location: PACU  Post pain: Pain level controlled  Post assessment: Post-op Vital signs reviewed  Last Vitals:  Filed Vitals:   01/07/13 1400  BP: 106/66  Pulse: 77  Temp: 36.7 C  Resp: 20    Post vital signs: Reviewed  Level of consciousness: sedated  Complications: No apparent anesthesia complications

## 2013-01-07 NOTE — OR Nursing (Signed)
15;15 received pt from [employer] rn. Orders received to dc pt to [facility]. Pt will be going to dr. Wynelle Bourgeois office for further evaluation per dr lathrops orders. Pt still co pain. Vital signs normal. Stated dr hatchett aware of plan and ok ,. Margarita Mail rn

## 2013-01-08 ENCOUNTER — Encounter (HOSPITAL_COMMUNITY): Payer: Self-pay | Admitting: *Deleted

## 2013-01-08 ENCOUNTER — Inpatient Hospital Stay (HOSPITAL_COMMUNITY)
Admission: AD | Admit: 2013-01-08 | Discharge: 2013-01-08 | Disposition: A | Discharge status: Home / Self Care | Payer: BC Managed Care – PPO | Source: Ambulatory Visit | Attending: Gynecology | Admitting: Gynecology

## 2013-01-08 ENCOUNTER — Inpatient Hospital Stay (HOSPITAL_COMMUNITY): Payer: BC Managed Care – PPO

## 2013-01-08 DIAGNOSIS — M25519 Pain in unspecified shoulder: Secondary | ICD-10-CM | POA: Insufficient documentation

## 2013-01-08 DIAGNOSIS — G8918 Other acute postprocedural pain: Secondary | ICD-10-CM | POA: Insufficient documentation

## 2013-01-08 DIAGNOSIS — R109 Unspecified abdominal pain: Secondary | ICD-10-CM | POA: Insufficient documentation

## 2013-01-08 DIAGNOSIS — Z30431 Encounter for routine checking of intrauterine contraceptive device: Secondary | ICD-10-CM | POA: Insufficient documentation

## 2013-01-08 DIAGNOSIS — N949 Unspecified condition associated with female genital organs and menstrual cycle: Secondary | ICD-10-CM | POA: Insufficient documentation

## 2013-01-08 LAB — URINALYSIS, ROUTINE W REFLEX MICROSCOPIC
Bilirubin Urine: NEGATIVE
Glucose, UA: NEGATIVE mg/dL
Ketones, ur: 15 mg/dL — AB
Protein, ur: NEGATIVE mg/dL
pH: 6 (ref 5.0–8.0)

## 2013-01-08 LAB — CBC: WBC: 15.5 10*3/uL — ABNORMAL HIGH (ref 4.0–10.5)

## 2013-01-08 LAB — URINE MICROSCOPIC-ADD ON

## 2013-01-08 MED ORDER — LACTATED RINGERS IV SOLN
INTRAVENOUS | Status: DC
  Administered 2013-01-08: 05:00:00 via INTRAVENOUS

## 2013-01-08 MED ORDER — LORAZEPAM 2 MG/ML IJ SOLN
1.0000 mg | Freq: Once | INTRAMUSCULAR | Status: AC
  Administered 2013-01-08: 1 mg via INTRAVENOUS
  Filled 2013-01-08: qty 1

## 2013-01-08 MED ORDER — LORAZEPAM 1 MG PO TABS
1.0000 mg | ORAL_TABLET | Freq: Once | ORAL | Status: AC
  Administered 2013-01-08: 1 mg via ORAL
  Filled 2013-01-08: qty 1

## 2013-01-08 MED ORDER — HYDROMORPHONE HCL PF 2 MG/ML IJ SOLN
2.0000 mg | Freq: Once | INTRAMUSCULAR | Status: AC
  Administered 2013-01-08: 2 mg via INTRAVENOUS
  Filled 2013-01-08: qty 1

## 2013-01-08 MED ORDER — SIMETHICONE 80 MG PO CHEW
160.0000 mg | CHEWABLE_TABLET | Freq: Once | ORAL | Status: DC
  Filled 2013-01-08: qty 2

## 2013-01-08 MED FILL — Heparin Sodium (Porcine) Inj 5000 Unit/ML: INTRAMUSCULAR | Qty: 1 | Status: AC

## 2013-01-08 NOTE — Op Note (Signed)
Julie Doyle, Julie Doyle NO.:  [id number]  MEDICAL RECORD NO.:  [id number]  LOCATION:  [facility]                          [facility]ACILITY:  [facility]  PHYSICIAN:  Ivor Costa. Farrel Gobble, M.D. DATE OF BIRTH:  1985-12-05  DATE OF PROCEDURE:  01/07/2013 DATE OF DISCHARGE:  01/07/2013                              OPERATIVE REPORT   PREOPERATIVE DIAGNOSES: 1. Right ovarian cyst. 2. Dysmenorrhea.  POSTOPERATIVE DIAGNOSES: 1. Right ovarian cyst. 2. Dysmenorrhea.  PROCEDURE:  Laparoscopic right ovarian cystectomy, pelvic washings, and placement of the Skyla IUD.  SURGEON:  Ivor Costa. Farrel Gobble, M.D.  Threasa HeadsHyacinth Meeker.  ANESTHESIA:  General.  IV FLUIDS:  1000 mL lactated Ringer's.  ESTIMATED BLOOD LOSS:  25 mL.  URINE OUTPUT:  275 mL.  PATHOLOGY: 1. Pelvic washings. 2. Cyst fluid. 3. Right ovarian cyst wall.  FINDINGS:  Anteverted uterus, markedly enlarged right ovary with smooth contour.  No excrescinces were noted.  The left ovary was slightly enlarged with what appeared to be functional cyst.  The tubes were unremarkable.  The ureter was visualized.  The upper abdomen was inspected and noted to be unremarkable.  DESCRIPTION OF PROCEDURE:  The patient was taken to the operating room, placed in a dorsal lithotomy position, prepped and draped in the usual sterile fashion after general anesthesia was induced.  Bimanual exam was performed.  The orientation of the uterus was confirmed.  A speculum was placed in the vagina.  The cervix was stabilized with a single-tooth tenaculum.  A uterine manipulator was then placed.  Gloves were changed and an infraumbilical incision was made with a scalpel after skin had been injected with Marcaine.  The pelvis was entered with the aid of the Optiview and placement was confirmed.  The pneumoperitoneum was then created.  The pelvis was inspected.  The findings were noted above.  Two lateral ports were on the left and right 5 mm were then made  under direct visualization.  Using these 2 lower ports, we were able to examine the pelvis. Prior to any manipulation in the pelvis, pelvic washings were obtained and sent for permanent. The right ovary was noted to be markedly free without any adhesions.  It was softened.  The ovary was stabilized in attempt to incise it with a needle was unsuccessful.  Then, using the EndoShear cautery, the ovarian serosa was gently incised.  The cyst wall was able to be visualized and it was stabilized from a rent and entered now with a needle.  Approximately 90 mL of clear fluid was removed with the needle aspirator and this was sent separately.   The rent was then extended more.  The suction irrigator was advanced into the ovary and additional fluid was obtained.  The serous of the ovary was then sharply dissected off the underlying ovarian cyst.  Once the rent was felt to be big enough, the cyst wall was grasped and holding the ovarian serosa with gentle traction, the cyst was able to be removed and what appeared to be in its entirety.  The cyst was then removed through the operative port.  The ovary was then irrigated with the copious amount of fluid.  It was felt  to be moderately hemostatic.  Gelfoam was then placed into the ovarian cyst site and the area was inspected.  A small amount of bleeding was noted.  The ovary was everted and small portions of the base were then treated with cautery juditiously.  The ovary was then placed back in the pelvis and we continued to inspect it. There was no fluid collecting in the pelvis at this point and no fluid or blood obviously coming from the ovarian cyst.  Interceed was then advanced through the infraumbilical port and into the pelvis.  The ovary was elevated and the tube and ovary were wrapped and placed under the uterus.   The instruments were removed under direct visualization.  The pneumoperitoneum was then released.  The two 5-mm ports were  reapproximated with dermabond.  The infraumbilical port fascia was identified, tented up, and reapproximated with 0 Vicryl.  Palpation prior to closure showed that there was no tissue trapped within the suture line.  The skin was then plicated with 4-0 plain and this port was similarly closed with Dermabond.  Attention was then turned back to the vagina.  The instruments were removed.  The speculum was placed in the vagina.  The cervix was visualized, stabilized with a single-tooth tenaculum.  The uterus was sounded to 3 inches.  The Skyla IUD was cocked and prepared and advanced gently through the cervix and into the uterine cavity to a level just below 7cm.  The strings were cut.  The speculum was removed.  The patient tolerated the procedure well.  Sponge, lap, and needle counts were correct x2.  She was extubated in the OR and transferred to the recovery in stable condition.     Ivor Costa. Farrel Gobble, M.D.     THL/MEDQ  D:  01/07/2013  T:  01/08/2013  Job:  [id number]

## 2013-01-08 NOTE — MAU Provider Note (Signed)
Chief Complaint: Post-op Problem  First Provider Initiated Contact with Patient 01/08/13 0522     SUBJECTIVE HPI: Genecis Veley Gapinski is a 27 y.o. G0P0000 non-pregnant female 1 day post-op laparoscopic right cystectomy and placement of IUD who presents severe right shoulder pain and sharp epigastric w/ inspiration since surgery yesterday. Called office immediately after discharge due to pain. Received cervical block w/ mild relief of pain. In so much pain tonight that she could not tolerate lying down. States she is so tired as a result. Took Percocet 2 tablets at 0300 w/ no relief.   Past Medical History  Diagnosis Date  . Factor 5 Leiden mutation, heterozygous   . Anxiety   . Clotting disorder   . Dysmenorrhea   . HSV-1 infection   . Blood dyscrasia     factor V   OB History   Grav Para Term Preterm Abortions TAB SAB Ect Mult Living   0 0 0 0 0 0 0 0 0 0      Past Surgical History  Procedure Laterality Date  . Orif of right fib  2011    Open Reduction of the right fibula  . Appendectomy  1998  . Wisdom tooth extraction  2012  . Cystectomy Right 01/07/2013    Laparoscopic   History   Social History  . Marital Status: Single    Spouse Name: N/A    Number of Children: N/A  . Years of Education: N/A   Occupational History  . Not on file.   Social History Main Topics  . Smoking status: Never Smoker   . Smokeless tobacco: Never Used  . Alcohol Use: Yes     Comment: socially   . Drug Use: No  . Sexually Active: Yes    Birth Control/ Protection: Condom   Other Topics Concern  . Not on file   Social History Narrative  . No narrative on file   No current facility-administered medications on file prior to encounter.   Current Outpatient Prescriptions on File Prior to Encounter  Medication Sig Dispense Refill  . LORazepam (ATIVAN) 0.5 MG tablet Take 1 mg by mouth at bedtime as needed for anxiety (and sleep).      March Rummage (EYE ALLERGY RELIEF OP) Apply 1  drop to eye daily.      Marland Kitchen nystatin-triamcinolone ointment (MYCOLOG) Apply topically 2 (two) times daily. Occassionally      . oxyCODONE-acetaminophen (PERCOCET) 5-325 MG per tablet Take 2 tablets by mouth every 4 (four) hours as needed for pain. use only as much as needed to relieve pain  20 tablet  0  . zolpidem (AMBIEN) 5 MG tablet Take 1 tablet (5 mg total) by mouth at bedtime as needed for sleep.  15 tablet  0  . HYDROcodone-acetaminophen (LORTAB) 7.5-325 MG per tablet Take 1 tablet by mouth every 6 (six) hours as needed for pain.  30 tablet  0   Allergies  Allergen Reactions  . Ivp Dye (Iodinated Diagnostic Agents) Hives and Shortness Of Breath    ROS: Pos for epigastric pain, pain w/ inspiration, right shoulder pain, anxiety, vaginal bleeding. Neg for fever, chills urinary complaints, GI complaints, low abd pain, incision pain, incision drainage. Chest pein , SOB, calf pain or swelling.   OBJECTIVE Blood pressure 135/94, pulse 98, resp. rate 24, height 5' 5.5" (1.664 m), weight 89.359 kg (197 lb), last menstrual period 12/12/2012, SpO2 99.00% on RA.  GENERAL: Well-developed, well-nourished female in severe distress.  HEENT: Normocephalic HEART: normal rate  and rhythm (difficult to assess due to pt breathing irregularly, making sounds RESP: Taking shallow, irregular breathes, appears distressed with deeper breathes, CTAB, but exam limited.  ABDOMEN: Soft, mild low abd tenderness. diminished BS x 4.  EXTREMITIES: Nontender, no edema NEURO: Alert and oriented PELVIC EXAM: NEFG, small amount of bright red blood on pad. Speculum exam deferred due pt intolerance of sitting or lying down (screaming when we attempted it.) and initial lack of low abd pain.  LAB RESULTS Results for orders placed during the hospital encounter of 01/08/13 (from the past 24 hour(s))  CBC     Status: Abnormal   Collection Time    01/08/13  5:10 AM      Result Value Range   WBC 15.5 (*) 4.0 - 10.5 K/uL   RBC  4.45  3.87 - 5.11 MIL/uL   Hemoglobin 13.3  12.0 - 15.0 g/dL   HCT 16.1  09.6 - 04.5 %   MCV 85.2  78.0 - 100.0 fL   MCH 29.9  26.0 - 34.0 pg   MCHC 35.1  30.0 - 36.0 g/dL   RDW 40.9  81.1 - 91.4 %   Platelets 309  150 - 400 K/uL    IMAGING NA  MAU COURSE 0540: Dilaudid 2 mg and Ativan 1 mg PO given w/ minimal relief. Notified Dr. Audie Box about pts pain, CBC, poor response to pain, anxiety meds. Recommends waiting a little longer for meds to work. Order Simethicone. Refused.   7829: No relief of shoulder and and epigastric pain even after second mg of Ativan IV. Now reporting low abd cramping coming back, same as what she had yesterday. Worried that IUD is being expelled. Asking repeatedly for more pain meds. Had previously stated that Ibuprofen works for her cramping. Offered, declined. Dr. Audie Box called, notified of continued pain, new cramping. Either he or Dr. Farrel Gobble will come assess pt. No pain meds or imaging ordered now.  29: Dr. Farrel Gobble at [facility]. IUD string not seen or felt, but exam limited but pt intolerance. BS [place] ordered to verify IUD placement. Dr. Farrel Gobble assuming care of pt.    [place], [place] 01/08/2013  8:28 AM   US Transvaginal Non-ob  01/08/2013   *RADIOLOGY REPORT*  Clinical Data: Pelvic and abdominal pain.  Recent partial cystectomy and IUD placement.  LMP 01/07/2013.  TRANSABDOMINAL AND TRANSVAGINAL ULTRASOUND OF PELVIS  Technique:  Both transabdominal and transvaginal ultrasound examinations of the pelvis were performed.  Transabdominal technique was performed for global imaging of the pelvis including uterus, ovaries, adnexal regions, and pelvic cul-de-sac.  It was necessary to proceed with endovaginal exam following the transabdominal exam to visualize the endometrium and ovaries.  3-D volume imaging was also performed to assess IUD location.  Comparison:  None.  Findings: Uterus:  8.3 x 3.4 x 5.1 cm.  No fibroids or other uterine mass identified.   Endometrium: IUD is seen in the endometrial cavity with the side arms approximately 1 cm below the fundal wall of the endometrial cavity.  Right ovary: A complex hypoechoic mass is seen in the right adnexa and extending into the pelvic cul-de-sac which measures approximately 4 x 8 cm, and has an appearance suspicious for postop hematoma.  Left ovary: 3.6 x 2.7 x 2.9 cm.  Normal appearance.  Other Findings:  A small amount of complex fluid or blood in the cul-de-sac.  IMPRESSION:  1.  IUD within endometrial cavity, with side arms approximately 1 cm below the fundal wall of the  endometrial cavity. 2.  4 x 8 cm complex right adnexal mass which extends into pelvic cul-de-sac, and likely represents postop hematoma.  Follow-up by pelvic ultrasound is recommended in 6-12 weeks.   Original Report Authenticated By: Myles Rosenthal, M.D.   US Transvaginal Non-ob  01/06/2013   See progress report  US Pelvis Complete  01/08/2013   *RADIOLOGY REPORT*  Clinical Data: Pelvic and abdominal pain.  Recent partial cystectomy and IUD placement.  LMP 01/07/2013.  TRANSABDOMINAL AND TRANSVAGINAL ULTRASOUND OF PELVIS  Technique:  Both transabdominal and transvaginal ultrasound examinations of the pelvis were performed.  Transabdominal technique was performed for global imaging of the pelvis including uterus, ovaries, adnexal regions, and pelvic cul-de-sac.  It was necessary to proceed with endovaginal exam following the transabdominal exam to visualize the endometrium and ovaries.  3-D volume imaging was also performed to assess IUD location.  Comparison:  None.  Findings: Uterus:  8.3 x 3.4 x 5.1 cm.  No fibroids or other uterine mass identified.  Endometrium: IUD is seen in the endometrial cavity with the side arms approximately 1 cm below the fundal wall of the endometrial cavity.  Right ovary: A complex hypoechoic mass is seen in the right adnexa and extending into the pelvic cul-de-sac which measures approximately 4 x 8 cm, and has  an appearance suspicious for postop hematoma.  Left ovary: 3.6 x 2.7 x 2.9 cm.  Normal appearance.  Other Findings:  A small amount of complex fluid or blood in the cul-de-sac.  IMPRESSION:  1.  IUD within endometrial cavity, with side arms approximately 1 cm below the fundal wall of the endometrial cavity. 2.  4 x 8 cm complex right adnexal mass which extends into pelvic cul-de-sac, and likely represents postop hematoma.  Follow-up by pelvic ultrasound is recommended in 6-12 weeks.   Original Report Authenticated By: Myles Rosenthal, M.D.    [place] - Dr. Farrel Gobble observed US at [facility]. IUD in appropriate position. Dr. Farrel Gobble requests that patient walk and then we can re-evaluate her pain.  1020 - Patient walked x 1 hour and rested. RN returned to [facility] to re-evaluate pain and patient was asleep Discussed patient's progress with Dr. Farrel Gobble. Ok for discharge. Follow-up as scheduled or sooner as needed  A: Post-op pain  P: Discharge home Patient has previously prescribed pain medications Patient encouraged to follow-up with [facility]YN as scheduled or sooner if symptoms should worsen or fail to improve Patient may return to MAU as needed  Freddi Starr, PA-C 01/08/2013 11:30 AM

## 2013-01-08 NOTE — MAU Provider Note (Signed)
I was present for much of the evaluation, as pt did not desaturate on pulse ox after ambulation PE unlikely.  IUD in proper place in cavity. It was suggested pt use simethicone and rectal suppository to relieve gas pains, she had paracervical block placed after OR with lidocaine/marcaine to decrease her cramping. Pt assured

## 2013-01-08 NOTE — MAU Note (Signed)
Pt reports she had surgery today to remove a rt ovarian cyst, they also placed an IUD while in the OR, pt states she went straight to the MD's office due to pain and they did a cervical block. States she has continued to have abd pain, po meds are not working. States she can't sit or lay down and she is just so tired.

## 2013-01-09 LAB — URINE CULTURE

## 2013-01-10 NOTE — Progress Notes (Signed)
Dr Farrel Gobble contacted patient personally with result.

## 2013-01-12 ENCOUNTER — Telehealth: Payer: Self-pay | Admitting: Gynecology

## 2013-01-12 NOTE — Telephone Encounter (Signed)
Patient called this evening stating that for past few hours she had been experiencing RLQ discomfort. She is s/p laparoscopic right ovarian cystectomy and placement of Skyla IUD on June the 10th. Patient the returned to the emergency room the following day c/o shoulder and epigastric pain.She had a CBC which was normal with the exception of slightly elevated WBC  (15K) and some blood in her urine but no evidence of UTI.  Ultrasound done demonstrated the following:   IMPRESSION: 1. IUD within endometrial cavity, with side arms approximately 1 cm below the fundal wall of the endometrial cavity. 2. 4 x 8 cm complex right adnexal mass which extends into pelvic cul-de-sac, and likely represents postop hematoma. Follow-up by pelvic ultrasound is recommended in 6-12 weeks.   Patient over the telephone stated that she is now having normal bowel movements since she stopped the narcotics. She is having no problem with voiding and is passing gas and does not feel bloated or has a fever.  She was offered to be seen tonight in the ER or contact Dr. Liliana Cline office in the morning for follow up and she has elected the latter. Review of her ultrasound report indicates a small post op hematoma that will need to be followed up.

## 2013-01-13 ENCOUNTER — Telehealth: Payer: Self-pay | Admitting: *Deleted

## 2013-01-13 ENCOUNTER — Ambulatory Visit (INDEPENDENT_AMBULATORY_CARE_PROVIDER_SITE_OTHER): Payer: BC Managed Care – PPO | Admitting: Gynecology

## 2013-01-13 VITALS — BP 118/74 | HR 72 | Resp 14 | Ht 65.0 in | Wt 188.0 lb

## 2013-01-13 DIAGNOSIS — N390 Urinary tract infection, site not specified: Secondary | ICD-10-CM

## 2013-01-13 DIAGNOSIS — G8918 Other acute postprocedural pain: Secondary | ICD-10-CM

## 2013-01-13 MED ORDER — AMOXICILLIN-POT CLAVULANATE 500-125 MG PO TABS: ORAL_TABLET | ORAL | Status: DC

## 2013-01-13 NOTE — Progress Notes (Signed)
Subjective:     Patient ID: Julie Doyle, female   DOB: 02-28-1986, 27 y.o.   MRN: [id number]  HPI Comments: Pt asked to come to office after calling on call MD reporting pain  Pt states that she has not specific pain including left shoulder/bicep pain, has seen chiropracter for back manipulation, pt is having bowel movements small, she is passing gas, tolerating diet.  Pt has had IUD-SKYLA placed in OR and had paracervical block placed in office afterwards. Pt reports using miralx and colace, bowels have been sluggish, small.  Pt has been walking at [employer].  Urine culture in ER +GBS    Review of Systems  Constitutional: Positive for chills. Negative for fever, diaphoresis and fatigue.  Respiratory: Negative for chest tightness and shortness of breath.   Cardiovascular: Negative for leg swelling.  Gastrointestinal: Positive for abdominal pain (right lower quadrant) and constipation (mild on stool softners). Negative for nausea, vomiting, diarrhea and abdominal distention.  Genitourinary: Positive for dysuria (mild) and vaginal bleeding (improving). Negative for frequency, hematuria, flank pain and difficulty urinating.  Musculoskeletal: Positive for myalgias (left bicep, full ROM).  Skin: Positive for rash (mild on abdomen). Negative for color change.       Objective:   Physical Exam  Constitutional: She is oriented to person, place, and time. She appears well-developed and well-nourished.  Cardiovascular: Normal rate.   Pulmonary/Chest: No respiratory distress.  Abdominal: Soft. Bowel sounds are normal. She exhibits no distension. There is tenderness. There is guarding (rigth lower quadrant). There is no rebound.  Genitourinary: There is no rash on the right labia. There is no rash on the left labia. Uterus is not enlarged and not tender. Cervix exhibits no motion tenderness (srrings seen) and no discharge. Right adnexum displays tenderness (imrproving). Left adnexum displays no mass and  no tenderness. There is bleeding (min dark) around the vagina.  Musculoskeletal: Normal range of motion.  Neurological: She is alert and oriented to person, place, and time.  Skin: Skin is warm and dry. Rash (fine rash on abdomen) noted. She is not diaphoretic.  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

## 2013-01-13 NOTE — Telephone Encounter (Signed)
Patient returned my call at 1210.  Reports that she has pain and "weird things" but at beginning of call she was going up an elevator to her mom's doctor appointment. She states she is not sure how much pain to expect after the surgery so I asked her to rate it with 10 being terminal cancer and she states pain increased last night to 8.5-9/10.  Reports constant pain where right ovary was and also separate intense cramping.  Rates pain now 5-6 and slowly increasing since last pain med was at 0830.  Additionally, patient is concerned about left arm pain, can not extend her arm and this is getting worse.  Will check with MD and call her back.  After review with Dr lathrop, needs OV, patient at her moms appt so cant be here till 130. Appt today at 130 with Dr Farrel Gobble.

## 2013-01-13 NOTE — Telephone Encounter (Signed)
Patient called MD on call last night with complaints of RLQ pain.  Patient declined evaluation in ER and agreed to wait to talk with [place] today.  Call to patient.  VM confirms correct name, LM following up from call last night.

## 2013-01-16 ENCOUNTER — Telehealth: Payer: Self-pay | Admitting: Gynecology

## 2013-01-16 NOTE — Telephone Encounter (Signed)
Patient said she went to [facility] to get her RX and they had her antibiotic but they didn't have the other medicine that had been prescribed.  She also had a question in regards to her procedure ,wants to know when she can take a shower. Her f/u is on 01-20-2013.

## 2013-01-16 NOTE — Telephone Encounter (Signed)
Spoke with pt about Rx. Per TL, pt can just use OTC Zantac or pepcid. Carafate is only used in the hospital. Pt OK to take a shower. Pt agreeable.

## 2013-01-20 ENCOUNTER — Ambulatory Visit (INDEPENDENT_AMBULATORY_CARE_PROVIDER_SITE_OTHER): Payer: BC Managed Care – PPO | Admitting: Gynecology

## 2013-01-20 ENCOUNTER — Encounter: Payer: Self-pay | Admitting: Gynecology

## 2013-01-20 VITALS — BP 122/80 | HR 78 | Resp 14 | Ht 65.0 in | Wt 183.0 lb

## 2013-01-20 DIAGNOSIS — Z9889 Other specified postprocedural states: Secondary | ICD-10-CM

## 2013-01-20 NOTE — Progress Notes (Signed)
Subjective:     Patient ID: Leward Quan, female   DOB: 02/08/1986, 27 y.o.   MRN: [id number]  HPI Comments: Pt here now 2w post op s/p laparoscopic ovarian cystectomy for benign serous cystadenoma, and IUD-Skyla placement, pt reports feeling much better she had had issues with gas immediately after surgery, her only complaint is that she has started having diarrhea after taking the augmentin for UTI, she completed the rx 2d go, the bowels are described as normal color and thinner, not water    Review of Systems Per HPI    Objective:   Physical Exam  Constitutional: She is oriented to person, place, and time. She appears well-developed and well-nourished.  Abdominal: Soft. Bowel sounds are normal. She exhibits no distension. There is no tenderness. There is no rebound and no guarding.  Neurological: She is alert and oriented to person, place, and time.  Pelvic exam:  \VULVA: normal appearing vulva with no masses, tenderness or lesions,  VAGINA: normal appearing vagina with normal color and discharge, no lesions,  CERVIX: normal appearing cervix without discharge or lesions, IUD strings seen, no CMT UTERUS: uterus is normal size, shape, consistency and nontender,  ADNEXA: normal adnexa in size, nontender and no masses.      Assessment:     post-op doing well  Diarrhea Questionable post-op hematoma noted on u/s POD1    Plan:     May begin to resume usual activities To call if diarrhea con't over next 3d  F/u u/s 4w post-op- hematoma felt to be combination of deflated cyst and small clot-to confirm

## 2013-02-18 ENCOUNTER — Other Ambulatory Visit: Payer: Self-pay | Admitting: Gynecology

## 2013-02-18 ENCOUNTER — Ambulatory Visit (INDEPENDENT_AMBULATORY_CARE_PROVIDER_SITE_OTHER): Payer: BC Managed Care – PPO | Admitting: Gynecology

## 2013-02-18 ENCOUNTER — Ambulatory Visit (INDEPENDENT_AMBULATORY_CARE_PROVIDER_SITE_OTHER): Payer: BC Managed Care – PPO

## 2013-02-18 DIAGNOSIS — G8918 Other acute postprocedural pain: Secondary | ICD-10-CM

## 2013-02-18 DIAGNOSIS — N949 Unspecified condition associated with female genital organs and menstrual cycle: Secondary | ICD-10-CM

## 2013-02-18 DIAGNOSIS — N8300 Follicular cyst of ovary, unspecified side: Secondary | ICD-10-CM

## 2013-02-18 DIAGNOSIS — N898 Other specified noninflammatory disorders of vagina: Secondary | ICD-10-CM

## 2013-02-18 DIAGNOSIS — Z113 Encounter for screening for infections with a predominantly sexual mode of transmission: Secondary | ICD-10-CM

## 2013-02-18 DIAGNOSIS — N831 Corpus luteum cyst of ovary, unspecified side: Secondary | ICD-10-CM

## 2013-02-18 LAB — POCT WET PREP (WET MOUNT)

## 2013-02-18 MED ORDER — METRONIDAZOLE 0.75 % VA GEL: 1.0000 | Freq: Two times a day (BID) | VAGINAL | Status: DC

## 2013-02-18 NOTE — Progress Notes (Signed)
  Pt presents for f/u u/s of post op hematoma, and IUD check, in addition pt reports having some vaginal/vulvar discomfort after oral sex about 2.5w ago.  She would like STD testing. Pt states that she still has some light bleeding since IUD placement but overall it has improved as has her pelvic pain. She is feeling well except for the vulvar dsicomfort. The u/s images as well as the prior images were reviewed with the patient, she still appears to have a small hematoma but is less than that seen post-op. She has 2 follicles on the left and a thick walled avascular cyst on the right, the IUD is on the cavity properly. PE: General: no distress Pelvic exam:  VULVA: normal appearing vulva with no masses, tenderness or lesions,  VAGINA: vaginal discharge - copious, grey and thick, limited by recent u/s,  CERVIX: normal appearing cervix without discharge or lesions, IUD strings noted, UTERUS: uterus is normal size, shape, consistency and nontender,  ADNEXA: normal adnexa in size, nontender and no masses,   WET MOUNT done - results: clue cells, pH < 4.5,  No yeast noted.  A/P resolving hematoma post-op without symptoms Bacterial vaginosis see orders STD screen- condoms stressed F/u prn

## 2013-02-19 LAB — RPR

## 2013-02-20 LAB — GC/CHLAMYDIA PROBE AMP, URINE: GC Probe Amp, Urine: NEGATIVE

## 2013-03-07 ENCOUNTER — Encounter: Payer: Self-pay | Admitting: Certified Nurse Midwife

## 2013-03-07 ENCOUNTER — Ambulatory Visit: Payer: BC Managed Care – PPO | Admitting: Gynecology

## 2013-03-07 ENCOUNTER — Ambulatory Visit (INDEPENDENT_AMBULATORY_CARE_PROVIDER_SITE_OTHER): Payer: BC Managed Care – PPO | Admitting: Certified Nurse Midwife

## 2013-03-07 VITALS — BP 90/60 | HR 64 | Resp 16 | Ht 65.0 in | Wt 189.0 lb

## 2013-03-07 DIAGNOSIS — B373 Candidiasis of vulva and vagina: Secondary | ICD-10-CM

## 2013-03-07 DIAGNOSIS — R350 Frequency of micturition: Secondary | ICD-10-CM

## 2013-03-07 LAB — POCT URINALYSIS DIPSTICK
Bilirubin, UA: NEGATIVE
Glucose, UA: NEGATIVE
Ketones, UA: NEGATIVE
Leukocytes, UA: NEGATIVE
Nitrite, UA: NEGATIVE
Protein, UA: NEGATIVE
Urobilinogen, UA: NEGATIVE
pH, UA: 5

## 2013-03-07 MED ORDER — FLUCONAZOLE 150 MG PO TABS: 150.0000 mg | ORAL_TABLET | Freq: Once | ORAL | Status: DC

## 2013-03-07 NOTE — Progress Notes (Signed)
26 y.o.SingleCaucasian female [id number] with a 2 week(s) history of the following:discharge described as white and creamy and vulvar itching Sexually active: yes Last sexual activity:7days ago. Pt also reports the following associated symptoms: none Patient has not tried over the counter treatment. . Period starting today with cramping(normal for patient), has taken Advil 600 mg with relief. Patient happy with Christean Grief IUD. Not using condoms now with sexually active.     Exam:  ZOX:WRUEAV, Bartholin's, Urethra, Skene's normal                WUJ:WJXBJYNWG: white and thick, blood, pH 4.5, wet prep done                Cx:  normal appearance, IUD string visualized and non tender, negative CMT                Uterus:normal size, non-tender, normal shape and consistency                Adnexa: normal adnexa and no mass, fullness, tenderness  Wet Prep shows: Positive for yeast, negative for BV or Trich  Poct urine- rbc-3+( on menses)  A: Yeast Vaginitis 2- Skyla IUD  Working well  P: Reviewed findings. Rx Diflucan see order 2- Aware of risk of STD's without condom use  RV prn

## 2013-03-08 NOTE — Progress Notes (Signed)
Note reviewed, agree with plan.  Graycie Halley, MD  

## 2013-03-10 ENCOUNTER — Ambulatory Visit: Payer: BC Managed Care – PPO | Admitting: Nurse Practitioner

## 2013-04-08 ENCOUNTER — Ambulatory Visit: Payer: BC Managed Care – PPO | Admitting: Internal Medicine

## 2013-05-09 ENCOUNTER — Telehealth: Payer: Self-pay | Admitting: Orthopedic Surgery

## 2013-05-09 NOTE — Telephone Encounter (Signed)
Spoke with pt who has been experiencing left ankle and top of foot swelling and tightness since about Wed night. No pain. Pt ate a large amount of salty popcorn the day before and thought that might be the cause, but was concerned that it was just one side that was affected. Pt has Skyla IUD inserted in July, and pt has hx of blood clotting disorder in her mom and grandmother. Grandmother had a blood clot that was not painful. Pt has not started any new exercise routine and doesn't remember hurting her ankle. Instructed pt to extend leg and flex foot back towards her head to check for Homan's sign. Pt denies pain in calf. Pt took a 325 mg aspirin about 30 minutes before calling. Pt has also taken some "natural water pills" to try to help with swelling and she has elevated her foot, which helped some. Advised pt to take OTC pain med this weekend as needed. Pt to watch for changes in leg, any increased swelling or sharp pains, and go to ER with these new symptoms. Advised pt to avoid salty foods for a while to see if that helps. Any further advice?

## 2013-05-14 NOTE — Telephone Encounter (Signed)
Patient is calling again about the pain in her foot... She kept saying she thought she may have a blood clot and wants [place] to do an ultrasound. She asked if a nurse could call her.

## 2013-05-14 NOTE — Telephone Encounter (Signed)
Spoke with Dr. Farrel Gobble and patient.  Advised patient to go to a local urgent care or see pcp for exam and evaluation.  Patient is agreeable and will do that.  Routing to provider for signature.

## 2013-06-17 ENCOUNTER — Telehealth: Payer: Self-pay | Admitting: Gynecology

## 2013-06-17 NOTE — Telephone Encounter (Signed)
Pt is having some vaginal itching and discomfort some cramping( no chart) also wants some std testing

## 2013-06-17 NOTE — Telephone Encounter (Signed)
Patient reports recent oral sex with new partner and has developed external irritation. Seems to be better now than this am. Also reports history of ovarian cyst and has times where it feels like that pain is returning.  Offered OV tomorrow, patient declines, stated she is moving tomorrow, requests appointment for next week and will cancel on Monday if symptoms resolve. Advised must give 24 hr notice to cancel and avoid charge. OV Tuesday 06-24-13 at 1100. Notified Dr lathrop coming from surgery that day so need to check with her and will call if this time does not work.  Is this time ok?

## 2013-06-20 NOTE — Telephone Encounter (Signed)
yes

## 2013-06-24 ENCOUNTER — Ambulatory Visit (INDEPENDENT_AMBULATORY_CARE_PROVIDER_SITE_OTHER): Payer: BC Managed Care – PPO | Admitting: Gynecology

## 2013-06-24 ENCOUNTER — Encounter: Payer: Self-pay | Admitting: Gynecology

## 2013-06-24 VITALS — BP 110/80 | HR 80 | Resp 14 | Ht 65.0 in | Wt 191.0 lb

## 2013-06-24 DIAGNOSIS — Z113 Encounter for screening for infections with a predominantly sexual mode of transmission: Secondary | ICD-10-CM

## 2013-06-24 DIAGNOSIS — N898 Other specified noninflammatory disorders of vagina: Secondary | ICD-10-CM

## 2013-06-24 NOTE — Progress Notes (Signed)
Subjective:     Patient ID: Julie Doyle, female   DOB: [birth year], 27 y.o.   MRN: [id number]  HPI Comments: Pt here reporting a week of cramping and itching with discharge that started 1w ago but seems better now that she had her cycle.  Pt reports some fatigue and pressure, concerns regarding recent laparoscopy for cystadenoma.  Pt with new partner but no vaginal penetration, oral sex but not well known to her. On and off right sided pain, none now. Current cycle was 2w late, is using Skyla IUD, over cramping is better but had mild cramping for 2w before this last cycle. Cycles lighter    Review of Systems  Constitutional: Negative for fever, chills and fatigue.  Gastrointestinal: Positive for diarrhea (recent). Negative for constipation.  Genitourinary: Negative for urgency (resolved), vaginal discharge, vaginal pain, menstrual problem (light with IUD) and pelvic pain.       Objective:   Physical Exam  Constitutional: She is oriented to person, place, and time. She appears well-developed and well-nourished.  Genitourinary: Vagina normal and uterus normal. There is no rash or tenderness on the right labia. There is no rash or tenderness on the left labia. Uterus is not tender. Cervix exhibits no motion tenderness and no discharge. Right adnexum displays no mass and no tenderness. Left adnexum displays no mass and no tenderness. No tenderness around the vagina. No vaginal discharge found.  Neurological: She is alert and oriented to person, place, and time.       Assessment:     Pelvic pain resolved History of serous cystadenoma     Plan:     Pt assured Would like STD screening will rto for dirty urine in am Suspect she had poor ovulation  This month with the delay in cycle and pressure now gone

## 2013-06-25 ENCOUNTER — Ambulatory Visit: Payer: Self-pay

## 2013-06-30 ENCOUNTER — Telehealth: Payer: Self-pay | Admitting: Gynecology

## 2013-06-30 NOTE — Telephone Encounter (Signed)
Pt lives in [place], was in town for holiday.  Ok to call to have done at home if she feels strongly about it, no need to make appt here, can be done at local health dept if easier

## 2013-06-30 NOTE — Telephone Encounter (Signed)
Pt DNKA for urine gc/chlamydia call to pt to reschedule appt

## 2013-06-30 NOTE — Telephone Encounter (Signed)
Patient notified that she can go to the health department if she desires, patient agreeable she sad she was helping her grandparents move and she totally forgot. Patient asked if she was gonna be charged a [facility] Fee I told her she wouldn't be.

## 2013-06-30 NOTE — Telephone Encounter (Signed)
Left Message To Call Back  

## 2013-07-11 NOTE — Telephone Encounter (Signed)
Patient says she is having RT side pain.

## 2013-07-11 NOTE — Telephone Encounter (Signed)
Spoke with patient.  C/o pain near hip bone, on R side. States it has been going on for one day. States that she had vaginal bleeding x 1 day last week as well as unprotected sex with new partner. She is wondering if she is pregnant and states "I need to get that checked out". Patient denies chills, fever, nausea, vomiting, diarrhea, dysuria and abnormal vaginal discharge. OV offered for Monday, patient declines. Patient wanted to see if pain goes away on it's own. Requests Wednday 12/17 appointment as she will be in town that day. Appointment scheduled. Advised patient due to hx if symptoms worsen to call back as soon as they do for earlier office visit or urgent care/er. Patient is agreeable.   Routing to provider for final review. Patient agreeable to disposition. Will close encounter

## 2013-07-14 ENCOUNTER — Ambulatory Visit: Payer: BC Managed Care – PPO | Admitting: Gynecology

## 2013-07-16 ENCOUNTER — Telehealth: Payer: Self-pay | Admitting: Gynecology

## 2013-07-16 ENCOUNTER — Ambulatory Visit: Payer: BC Managed Care – PPO | Admitting: Gynecology

## 2013-07-16 NOTE — Telephone Encounter (Signed)
Patient had a ov today with lathrop for right side pain. Called and canceled appt last night on machine due to fever and being sick. Lm on vm to call back to reschedule

## 2013-07-16 NOTE — Telephone Encounter (Signed)
""  the mailbox is full and cannot accept any new messages at this time. goodbye" Attempted call. Unable to leave voice mail.

## 2013-08-05 NOTE — Telephone Encounter (Signed)
LMTCB for update and assess need for appt.

## 2013-08-12 ENCOUNTER — Telehealth: Payer: Self-pay | Admitting: Gynecology

## 2013-08-12 NOTE — Telephone Encounter (Signed)
Spoke with patient. She has been having ongoing RLQ pain. Has cancelled all recent appointments scheduled. States that for two months is having pressure when voiding. After voiding feels okay. Denies fevers or flank pain. Offered office visit and patient is agreeable.  Office visit for 08/13/13 scheduled.  Routing to provider for final review. Patient agreeable to disposition. Will close encounter

## 2013-08-12 NOTE — Telephone Encounter (Signed)
Pt wants to talk with Dr. Charlies Constable she is experiencing a lot of pain and would like to come in to be checked out.

## 2013-08-13 ENCOUNTER — Encounter: Payer: Self-pay | Admitting: Gynecology

## 2013-08-13 ENCOUNTER — Ambulatory Visit (INDEPENDENT_AMBULATORY_CARE_PROVIDER_SITE_OTHER): Payer: BC Managed Care – PPO | Admitting: Gynecology

## 2013-08-13 VITALS — BP 110/68 | HR 80 | Resp 14 | Ht 65.0 in | Wt 196.0 lb

## 2013-08-13 DIAGNOSIS — G8929 Other chronic pain: Secondary | ICD-10-CM

## 2013-08-13 DIAGNOSIS — Z9889 Other specified postprocedural states: Secondary | ICD-10-CM

## 2013-08-13 DIAGNOSIS — R1031 Right lower quadrant pain: Secondary | ICD-10-CM

## 2013-08-13 DIAGNOSIS — Z8742 Personal history of other diseases of the female genital tract: Secondary | ICD-10-CM

## 2013-08-13 LAB — POCT URINALYSIS DIPSTICK
UROBILINOGEN UA: NEGATIVE
pH, UA: 5

## 2013-08-13 NOTE — Progress Notes (Signed)
Subjective:     Patient ID: Julie Doyle, female   DOB: [birth year], 28 y.o.   MRN: [id number]  HPI Comments: Pt here reporting right lower quadrant pain and dysuria.  Pt reports sharp and cramping, worse with bowel movements.  Symptoms hare been getting worse over the last 2.61m.  Similar to the pain that preceeded cystectomy.  Pt reports bowels are normal.  She has some premenstrual cramps but the menses are better, flow is much lighter.  Pt takes motrin 400mg  with marginal relief.  Pelvic Pain The patient's primary symptoms include pelvic pain. The patient's pertinent negatives include no vaginal discharge. Associated symptoms include dysuria. Pertinent negatives include no chills or fever.     Review of Systems  Constitutional: Negative for fever and chills.  Genitourinary: Positive for dysuria and pelvic pain. Negative for vaginal bleeding, vaginal discharge and menstrual problem.       Objective:   Physical Exam  Constitutional: She is oriented to person, place, and time. She appears well-developed and well-nourished.  Neurological: She is alert and oriented to person, place, and time.  Skin: Skin is warm and dry.  Pelvic exam:  VULVA: normal appearing vulva with no masses, tenderness or lesions,  VAGINA: normal appearing vagina with normal color and discharge, no lesions,  CERVIX: normal appearing cervix without discharge or lesions, cervical motion tenderness present, IUD strings noted,  UTERUS: uterus is normal size, shape, consistency and nontender,  ADNEXA: nonspecific tenderness.     Assessment:     History of cystectomy dysuria     Plan:     Will repeat u/s to rule out rcurrence vs scar tissue Check urine culture

## 2013-08-13 NOTE — Addendum Note (Signed)
Addended by: Alfonzo Feller on: 08/13/2013 01:39 PM   Modules accepted: Orders

## 2013-08-14 LAB — URINE CULTURE
COLONY COUNT: NO GROWTH
Organism ID, Bacteria: NO GROWTH

## 2013-08-14 NOTE — Telephone Encounter (Signed)
Telephoned patient/spoke with patient/advised of $5 copay insurance quote received for PUS/ scheduled PUS, explained that cancellations require 72 hour notice or she would face $75 cx fee//ssf

## 2013-08-15 ENCOUNTER — Telehealth: Payer: Self-pay | Admitting: Gynecology

## 2013-08-15 ENCOUNTER — Ambulatory Visit (INDEPENDENT_AMBULATORY_CARE_PROVIDER_SITE_OTHER): Payer: BC Managed Care – PPO | Admitting: Gynecology

## 2013-08-15 ENCOUNTER — Encounter: Payer: Self-pay | Admitting: Gynecology

## 2013-08-15 VITALS — BP 112/64 | HR 99 | Temp 98.7°F | Resp 16 | Ht 65.0 in | Wt 196.0 lb

## 2013-08-15 DIAGNOSIS — N644 Mastodynia: Secondary | ICD-10-CM

## 2013-08-15 MED ORDER — CYCLOBENZAPRINE HCL 5 MG PO TABS: 5.0000 mg | ORAL_TABLET | Freq: Three times a day (TID) | ORAL | Status: DC | PRN

## 2013-08-15 NOTE — Progress Notes (Signed)
Subjective:     Patient ID: Julie Doyle, female   DOB: 1985/12/11, 28 y.o.   MRN: [id number]  HPI Comments: Pt here reporting breast tenderness that is disrupting sleep.  Pt has had in the past and reports symptoms worse after removal of bra, she feels that she needs to support breast.  She denies change in bra, or breast size, she wears underwire, denies any skin changes.  Pt recently started at the gym and is doing exercises with her arms with weights and machines.  Pt did not think anything was strenuous.  Pt had evaluation in past and was instructed to stop caffeine.      Review of Systems  Skin: Negative for color change, pallor and rash.       Objective:   Physical Exam  Nursing note and vitals reviewed. Constitutional: She is oriented to person, place, and time. She appears well-developed and well-nourished.  Pulmonary/Chest: Right breast exhibits tenderness. Right breast exhibits no inverted nipple, no mass, no nipple discharge and no skin change. Left breast exhibits tenderness. Left breast exhibits no inverted nipple, no mass, no nipple discharge and no skin change.    Lymphadenopathy:    She has no axillary adenopathy.       Right axillary: No pectoral and no lateral adenopathy present.       Left axillary: No pectoral and no lateral adenopathy present.      Right: No supraclavicular adenopathy present.       Left: No supraclavicular adenopathy present.  Neurological: She is alert and oriented to person, place, and time.  no evidence of yeast on skin,  Left breast deflected-tender underneath Breasts examined supine and upright    Assessment:     nonspecific breast tenderness, possible musculoskeketal     Plan:     Avoid upper extremity exercises Well fitting or double sports bra Vit E twice a day Limit caffeine rx flexeril  Keep appt next week to reassess

## 2013-08-15 NOTE — Telephone Encounter (Signed)
Patient is having intense breast pain in left breast it is keeping her up at night. Has pus with lathrop on Tuesday.

## 2013-08-15 NOTE — Telephone Encounter (Signed)
Patient with L Breast pain x 3 days. States she can feel a quarter sized lump. Hx cystic breast per patient. Advised office visit suggested.  Appointment made.  Routing to provider for final review. Patient agreeable to disposition. Will close encounter

## 2013-08-15 NOTE — Patient Instructions (Signed)
Avoid upper extremity exercises Well fitting or double sports bra Vit E twice a day Limit caffeine Keep appt next week to reassess

## 2013-08-18 ENCOUNTER — Telehealth: Payer: Self-pay | Admitting: Gynecology

## 2013-08-18 NOTE — Telephone Encounter (Signed)
Voicemail confirmed patient name/ left message for patient to call back and rescheduled PUS. It had to be cancelled for 01.20.2015//ssf

## 2013-08-19 ENCOUNTER — Other Ambulatory Visit: Payer: BC Managed Care – PPO

## 2013-08-19 ENCOUNTER — Other Ambulatory Visit: Payer: BC Managed Care – PPO | Admitting: Gynecology

## 2013-08-19 NOTE — Telephone Encounter (Signed)
Patient is returning a call to Gabriel Cirri to reschedule her PUS appointment.

## 2013-08-19 NOTE — Telephone Encounter (Signed)
Per phone note from earlier today noting that pt needed to reschedule PUS, called pt to reschedule. Sched appt for 08-26-13 at 3 pm for PUS and 3:30 pm with TL for consult. Advised of cancellation fee if pt does not give 72 hr notice. Pt says that her grandmother is in Hospice care and she isn't sure what will happen over the next week or 2. Pt will call if she needs to reschedule. Pt already aware of copay from last call with [place].

## 2013-08-19 NOTE — Telephone Encounter (Signed)
Patient left message after hours 08/18/13 at 4:45 PM calling to reschedule this appointment. She wants to be sure it is with Dr. Charlies Constable.

## 2013-08-26 ENCOUNTER — Other Ambulatory Visit: Payer: Self-pay | Admitting: Gynecology

## 2013-08-26 ENCOUNTER — Ambulatory Visit (INDEPENDENT_AMBULATORY_CARE_PROVIDER_SITE_OTHER): Payer: BC Managed Care – PPO

## 2013-08-26 ENCOUNTER — Ambulatory Visit (INDEPENDENT_AMBULATORY_CARE_PROVIDER_SITE_OTHER): Payer: BC Managed Care – PPO | Admitting: Gynecology

## 2013-08-26 ENCOUNTER — Encounter: Payer: Self-pay | Admitting: Gynecology

## 2013-08-26 VITALS — BP 124/94 | HR 89 | Resp 20 | Ht 65.0 in | Wt 194.0 lb

## 2013-08-26 DIAGNOSIS — Z9889 Other specified postprocedural states: Secondary | ICD-10-CM

## 2013-08-26 DIAGNOSIS — R1031 Right lower quadrant pain: Secondary | ICD-10-CM

## 2013-08-26 DIAGNOSIS — N83201 Unspecified ovarian cyst, right side: Secondary | ICD-10-CM

## 2013-08-26 DIAGNOSIS — G8929 Other chronic pain: Secondary | ICD-10-CM

## 2013-08-26 DIAGNOSIS — D270 Benign neoplasm of right ovary: Secondary | ICD-10-CM

## 2013-08-26 DIAGNOSIS — D279 Benign neoplasm of unspecified ovary: Secondary | ICD-10-CM

## 2013-08-26 DIAGNOSIS — Z8742 Personal history of other diseases of the female genital tract: Secondary | ICD-10-CM

## 2013-08-26 DIAGNOSIS — N83209 Unspecified ovarian cyst, unspecified side: Secondary | ICD-10-CM

## 2013-08-26 NOTE — Progress Notes (Signed)
     Pt here for u/s, she has a several month history of sharp cramping in right pelvis, worse with bowel movements.  Symptoms are similar to those she had before her cystectomy 18m ago-serous cystadenoma.   U/s today shows a clear cyst in the right adnexa 4.5cmx2.9cm with a small solid focus with a feeder vessel, low intensity doppler flow noted.  Minimal free fluid noted. Uterus noted with IUD in place. Findings reviewed with pt, she could have a recurrence of her cystadenoma as it was peeled out, could be functional cyst.  There was a questionable small solid focus noted on a prior u/s not seen in our office or other films previously. We discussed defferential, We will get a Ca125 now and if normal will do a short interval u/s in 71m. She is agreeable Questions addressed  44m spent discussing management of recurrent ovarian cyst, >50%  Face to face

## 2013-08-27 LAB — CA 125: CA 125: 4.1 U/mL (ref 0.0–30.2)

## 2013-09-01 ENCOUNTER — Telehealth: Payer: Self-pay | Admitting: Gynecology

## 2013-09-01 NOTE — Telephone Encounter (Signed)
Phone call returned to patient that came through answering service tonight. Patient reporting RLQ pain since having ultrasound which diagnosed right ovarian cyst. States nausea and low grade fever 99.6 degrees Fahrenheit. Had a normal bowel movement today. Some "stabby" pain with voiding.   Has not taken pain medication.  Spotting 13 days ago, but now with mucous drainage from vagina.   I recommended re-evaluation of the patient. She will call the office tomorrow am for an appointment, and she was invited to go the the Emergency Department if she has any worsening of her symptoms during the night.  She will take ibuprofen tonight and try a heating pad for the pain.

## 2013-09-01 NOTE — Telephone Encounter (Signed)
Patient calls thru to answering service prior to me getting to call her. PUS 08-26-13, ovarian cyst.  Patient reports pain 4.5-5/10. Intense nausea, no vomiting, no known fever. Reports menses was 13 days ago but still has red mucous discharge. Also complains of lightheadedness. Advised nausea may not be related, GI virus going around. Patient states this is not what she has.  Patient asking if she can use "cold laser" from chiropractor for pain. States pain is pretty bad where cyst is.

## 2013-09-01 NOTE — Telephone Encounter (Signed)
Patient wants to speak with nurse about discharge and how it may relate to a small cyst she has.

## 2013-09-02 ENCOUNTER — Encounter: Payer: Self-pay | Admitting: Gynecology

## 2013-09-02 ENCOUNTER — Ambulatory Visit (INDEPENDENT_AMBULATORY_CARE_PROVIDER_SITE_OTHER): Payer: BC Managed Care – PPO | Admitting: Gynecology

## 2013-09-02 VITALS — BP 131/76 | HR 80 | Temp 99.2°F | Ht 65.0 in | Wt 192.0 lb

## 2013-09-02 DIAGNOSIS — R1031 Right lower quadrant pain: Secondary | ICD-10-CM

## 2013-09-02 DIAGNOSIS — D279 Benign neoplasm of unspecified ovary: Secondary | ICD-10-CM

## 2013-09-02 DIAGNOSIS — D270 Benign neoplasm of right ovary: Secondary | ICD-10-CM

## 2013-09-02 DIAGNOSIS — L089 Local infection of the skin and subcutaneous tissue, unspecified: Secondary | ICD-10-CM

## 2013-09-02 DIAGNOSIS — G8929 Other chronic pain: Secondary | ICD-10-CM

## 2013-09-02 LAB — CBC WITH DIFFERENTIAL/PLATELET
BASOS PCT: 1 % (ref 0–1)
Basophils Absolute: 0 10*3/uL (ref 0.0–0.1)
Eosinophils Absolute: 0.2 10*3/uL (ref 0.0–0.7)
Eosinophils Relative: 4 % (ref 0–5)
HCT: 23.6 % — ABNORMAL LOW (ref 36.0–46.0)
Hemoglobin: 7.9 g/dL — ABNORMAL LOW (ref 12.0–15.0)
Lymphocytes Relative: 31 % (ref 12–46)
Lymphs Abs: 1.5 10*3/uL (ref 0.7–4.0)
MCH: 29.7 pg (ref 26.0–34.0)
MCHC: 33.5 g/dL (ref 30.0–36.0)
MCV: 88.7 fL (ref 78.0–100.0)
Monocytes Absolute: 0.2 10*3/uL (ref 0.1–1.0)
Monocytes Relative: 5 % (ref 3–12)
NEUTROS ABS: 2.8 10*3/uL (ref 1.7–7.7)
NEUTROS PCT: 59 % (ref 43–77)
PLATELETS: 175 10*3/uL (ref 150–400)
RBC: 2.66 MIL/uL — ABNORMAL LOW (ref 3.87–5.11)
RDW: 13.5 % (ref 11.5–15.5)
WBC: 4.7 10*3/uL (ref 4.0–10.5)

## 2013-09-02 MED ORDER — DOXYCYCLINE HYCLATE 100 MG PO CAPS: 100.0000 mg | ORAL_CAPSULE | Freq: Two times a day (BID) | ORAL | Status: DC

## 2013-09-02 MED ORDER — METOCLOPRAMIDE HCL 10 MG PO TABS: 10.0000 mg | ORAL_TABLET | Freq: Three times a day (TID) | ORAL | Status: DC | PRN

## 2013-09-02 NOTE — Progress Notes (Signed)
Subjective:     Patient ID: Julie Doyle, female   DOB: April 02, 1986, 28 y.o.   MRN: [id number]  HPI Comments: Pt seen her for PUS last week that showed a clear ovarian cyst with a feeder vessel on the right that she recently had a cystectomy for a cystadenoma.  Pt reports that she noticed an increase in right lower quadrant pain after the u/s but was much less than before surgery.  Pt states that over the weekend she developed a severe headache, nausea without emesis, general malaise and decreased appetite.  Pt reports low grade fever of 100.   Pt also reports cystic lesions that appeared on her chest, upper back and a lesion on her left breast that got large and drained some fuild  Pelvic Pain The patient's primary symptoms include pelvic pain. The patient's pertinent negatives include no vaginal discharge. Associated symptoms include chills, a fever and rash.     Review of Systems  Constitutional: Positive for fever, chills and appetite change.  Genitourinary: Positive for pelvic pain. Negative for decreased urine volume, vaginal bleeding and vaginal discharge.  Skin: Positive for rash.       Objective:   Physical Exam  Nursing note and vitals reviewed. Constitutional: She appears well-developed and well-nourished.  Genitourinary: Vagina normal and uterus normal. There is no rash or tenderness on the right labia. There is no rash or tenderness on the left labia. Uterus is not tender. Cervix exhibits no motion tenderness and no discharge. Right adnexum displays mass and fullness. Right adnexum displays no tenderness. Left adnexum displays no mass, no tenderness and no fullness.  Lymphadenopathy:       Right: No inguinal adenopathy present.       Left: No inguinal adenopathy present.  Skin: Rash noted. Rash is pustular. Rash is not vesicular.          Assessment:     History of right ovarian cyst Fever rash     Plan:     Pelvic pain unchanged from previous-will keep PUS as  scheduled, NSAIDS, reglan prn Pt to call if worsens Cbc with diff Questionable viral illness  Skin lesion with signs of infection-abx called in

## 2013-09-03 ENCOUNTER — Telehealth: Payer: Self-pay | Admitting: *Deleted

## 2013-09-03 ENCOUNTER — Encounter: Payer: Self-pay | Admitting: Gynecology

## 2013-09-03 ENCOUNTER — Ambulatory Visit (INDEPENDENT_AMBULATORY_CARE_PROVIDER_SITE_OTHER): Payer: BC Managed Care – PPO | Admitting: Gynecology

## 2013-09-03 VITALS — BP 116/78 | HR 64 | Temp 98.9°F | Resp 18 | Ht 65.0 in | Wt 194.0 lb

## 2013-09-03 DIAGNOSIS — G8929 Other chronic pain: Secondary | ICD-10-CM

## 2013-09-03 DIAGNOSIS — R1031 Right lower quadrant pain: Secondary | ICD-10-CM

## 2013-09-03 LAB — CBC WITH DIFFERENTIAL/PLATELET
BASOS ABS: 0.1 10*3/uL (ref 0.0–0.1)
Basophils Relative: 1 % (ref 0–1)
EOS ABS: 0.2 10*3/uL (ref 0.0–0.7)
Eosinophils Relative: 3 % (ref 0–5)
HCT: 41.3 % (ref 36.0–46.0)
Hemoglobin: 14.1 g/dL (ref 12.0–15.0)
LYMPHS ABS: 2.4 10*3/uL (ref 0.7–4.0)
Lymphocytes Relative: 29 % (ref 12–46)
MCH: 29.4 pg (ref 26.0–34.0)
MCHC: 34.1 g/dL (ref 30.0–36.0)
MCV: 86.2 fL (ref 78.0–100.0)
Monocytes Absolute: 0.5 10*3/uL (ref 0.1–1.0)
Monocytes Relative: 6 % (ref 3–12)
NEUTROS PCT: 61 % (ref 43–77)
Neutro Abs: 5.2 10*3/uL (ref 1.7–7.7)
Platelets: 326 10*3/uL (ref 150–400)
RBC: 4.79 MIL/uL (ref 3.87–5.11)
RDW: 13.1 % (ref 11.5–15.5)
WBC: 8.5 10*3/uL (ref 4.0–10.5)

## 2013-09-03 NOTE — Telephone Encounter (Signed)
Call to patient's mother Caryl, VM has name confirmation. LMTCB trying to reach [place].

## 2013-09-03 NOTE — Telephone Encounter (Signed)
Patient returned call. Advised that i had called her mom.  Advised that Dr Charlies Constable has noted an change in her HGB and would like her to come in this afternoon to be checked.  OV today at 2pm. Patient agreeable.  Routing to provider for final review. Patient agreeable to disposition. Will close encounter

## 2013-09-03 NOTE — Progress Notes (Signed)
Subjective:     Patient ID: Julie Doyle, female   DOB: 06-Mar-1986, 28 y.o.   MRN: [id number]  HPI Comments: Pt asked to come back to office after cbc done yesterday for wbc showed a hb 7.3.  Pt's last documented hb 13.  Pt did not appear acute at office visit.  Pt is without any change in symptoms.    Review of Systems  Constitutional: Negative for fever, chills, diaphoresis and fatigue.  Respiratory: Negative for shortness of breath.   Skin: Negative for pallor.  Neurological: Negative for weakness and light-headedness.       Objective:   Physical Exam  Nursing note and vitals reviewed. Constitutional: She appears well-developed and well-nourished. She does not have a sickly appearance. She does not appear ill. No distress.  HENT:  Mouth/Throat: Oropharynx is clear and moist and mucous membranes are normal. Mucous membranes are not pale and not dry.  Eyes: Lids are normal.  Abdominal: Normal appearance and bowel sounds are normal. She exhibits no fluid wave and no mass. There is no tenderness. There is no rebound and no guarding.  Neurological: She is alert.  Skin: Skin is warm and dry. No pallor.  good capillary refill Resting HR 72, upright 78 Hb 14.4    Assessment:     No evidence of acute abdomen or bleeding on PE      Plan:     Repeat CBC done today Will hold PUS in am, pt will cancel if she feels better, she had not taken anything for pain, recommend NSAIDS-agrees

## 2013-09-03 NOTE — Telephone Encounter (Signed)
Unable to leave message due to VM full.  No other number listed for patient.

## 2013-09-04 ENCOUNTER — Other Ambulatory Visit: Payer: BC Managed Care – PPO | Admitting: Obstetrics and Gynecology

## 2013-09-04 ENCOUNTER — Telehealth: Payer: Self-pay | Admitting: Gynecology

## 2013-09-04 ENCOUNTER — Other Ambulatory Visit: Payer: BC Managed Care – PPO

## 2013-09-04 NOTE — Telephone Encounter (Signed)
Patient canceled her PUS appointment today.

## 2013-09-05 ENCOUNTER — Telehealth: Payer: Self-pay | Admitting: Gynecology

## 2013-09-05 DIAGNOSIS — N83209 Unspecified ovarian cyst, unspecified side: Secondary | ICD-10-CM

## 2013-09-05 MED ORDER — TRAMADOL HCL ER 100 MG PO TB24: 100.0000 mg | ORAL_TABLET | Freq: Every day | ORAL | Status: DC

## 2013-09-05 NOTE — Telephone Encounter (Signed)
S/w patient got another preferred pharmacy. Tramadol 100 mg #10/0 called in [facility] [address] ; patient is aware.  Routed to Dr.TL CC: Ms. Gay Filler

## 2013-09-05 NOTE — Telephone Encounter (Signed)
Patient is having lower right side pain. Patient says she "feels as if her skin is stretching" . Please call patient.

## 2013-09-05 NOTE — Telephone Encounter (Signed)
Called patient's preferred pharmacy [facility] on [address] to call in Tramadol Rx; they do not carry it there only the 50 mg tablet only. Called patient's vm and LM to see which pharmacy she wants rx called into.

## 2013-09-05 NOTE — Telephone Encounter (Signed)
Call to patient.  She reports that she has different discomfort today.feels cramps all over, low in her back and worse on right "where cyst is". She states pain is more dull and constant than sharp like it was before.  Feels like she if full  And feels more pronounced on right side when sitting.  Feels like right leg is weak.  Has not checked temp but doesn't feel like has a fever. LMP 08-23-13  Reviewed with Dr lathrop.  To try Ultram and see if this helps.  Heating pad on low (with precautions) and decreased activity.  If symptoms worsen in any way, go to [facility].  Call Monday with update and will order PUS for Tuesday (next available in office).  If feeling better can cancel this.   If agree with above, can close encounter.

## 2013-09-09 ENCOUNTER — Ambulatory Visit (INDEPENDENT_AMBULATORY_CARE_PROVIDER_SITE_OTHER): Payer: BC Managed Care – PPO | Admitting: Gynecology

## 2013-09-09 ENCOUNTER — Ambulatory Visit (INDEPENDENT_AMBULATORY_CARE_PROVIDER_SITE_OTHER): Payer: BC Managed Care – PPO

## 2013-09-09 VITALS — BP 118/82 | HR 84 | Temp 98.8°F | Resp 14 | Ht 65.0 in | Wt 193.0 lb

## 2013-09-09 DIAGNOSIS — Z9889 Other specified postprocedural states: Secondary | ICD-10-CM

## 2013-09-09 DIAGNOSIS — N83209 Unspecified ovarian cyst, unspecified side: Secondary | ICD-10-CM

## 2013-09-09 DIAGNOSIS — D270 Benign neoplasm of right ovary: Secondary | ICD-10-CM

## 2013-09-09 DIAGNOSIS — Z8742 Personal history of other diseases of the female genital tract: Secondary | ICD-10-CM

## 2013-09-09 DIAGNOSIS — D279 Benign neoplasm of unspecified ovary: Secondary | ICD-10-CM

## 2013-09-09 DIAGNOSIS — R102 Pelvic and perineal pain: Secondary | ICD-10-CM

## 2013-09-09 DIAGNOSIS — N949 Unspecified condition associated with female genital organs and menstrual cycle: Secondary | ICD-10-CM

## 2013-09-09 NOTE — Progress Notes (Signed)
     Pt here for f/u u/s.  Pt reports feeling a bit better over the last few days but she does report a low grade temp 99 without other symptoms.  She is taking the doxycycline and her skin eruptions are mostly gone.  Pt decided not to take the tramadol as her mother had some 50mg 's at home and they seem to be working.   U/s images from today were reviewed and compared with all her prior films before her surgery.  Her right ovarina cyst has increased from 2w ago and is now 5x3.6 and the solid portion is again noted within the cyst.  The cyst is otherwise clear.  The solid component was not seen on any of the u/s pre-operatively.  Ca125-4 IUD is within the cavity. Pt initially had an interval scan for 4w-2w from now, I suggest she keep it, to determine if the cyst in still enlarging or may be regressing since the last u/s. I will refer her to her PCP regarding the low grade temps as her wbc is normal, and she has no specific signs and she is agreeable. Signs of torsion were reviewed and she is asked to call with concerns. We will plan on u/s in 2w 77m spent discussing management of recurrent ovarian cyst, >50% face to face

## 2013-09-12 ENCOUNTER — Telehealth: Payer: Self-pay | Admitting: Gynecology

## 2013-09-12 NOTE — Telephone Encounter (Signed)
Dr. Charlies Constable please advise.

## 2013-09-12 NOTE — Telephone Encounter (Signed)
Spoke with mother, we have not heard back as of yet but explained u/s findings again.  Questions addressed. Assured.

## 2013-09-12 NOTE — Telephone Encounter (Signed)
Spoke with patient and advised I had not received a message back from Dr. Charlies Constable and there were no messages in the system. Advised that Dr. Charlies Constable has left for the day and I would return her call when I had a return message from Dr. Charlies Constable.  Advised she may also send a message via mychart as well if she would like.

## 2013-09-12 NOTE — Telephone Encounter (Signed)
Patient is calling to see if dr Charlies Constable had talked to the oncologist she was talking about yet. She was just nervous because she had heard anything

## 2013-09-15 ENCOUNTER — Telehealth: Payer: Self-pay | Admitting: Gynecology

## 2013-09-15 NOTE — Telephone Encounter (Signed)
Pt calling to let Dr Charlies Constable know she is having pain today. She has an ovarian cyst.

## 2013-09-15 NOTE — Telephone Encounter (Signed)
3:20 Return call to patient. She reports pain has increased today and she feels worse than she has over last 4-5 days.  Today has that continued "annoying feeling of fullness in abdomen when sitting up to do work."  She works from home and is just so uncomfortable.  Took 3 Motrin tablets at 1pm and no relief.  States sometimes tramadol helps and sometimes it doesn't.  Has not taken it today.  Reports pain scale of 7 today. Advised I will check with Dr Charlies Constable and call her back.  Patient asking to check with DR Charlies Constable about consult with specialist.  Checked with Dr Charlies Constable and Dewitt Hoes of status.  She recommends to try Tramadol that was called in last week and is stronger than dose she has at home from her mom.  (has 50 mg at home but Dr lathrop prescribed 100mg ) Patient reports she was concerned about cost, $30. Advised that we do not generally prescribe stronger meds, like narcotics, particularly for longer term management so we are limited on other options. Advised will check with Dr Charlies Constable. Any cheaper option?   Also notified Dr Cephus Slater has not received a response but will call Dr Fermin Schwab again. Patient states her PUS is next week and she is hopeful to have a plan for this then.

## 2013-09-16 NOTE — Telephone Encounter (Signed)
Ok to close encounter. 

## 2013-09-17 NOTE — Telephone Encounter (Signed)
agree

## 2013-09-18 ENCOUNTER — Encounter: Payer: Self-pay | Admitting: Internal Medicine

## 2013-09-18 ENCOUNTER — Ambulatory Visit (INDEPENDENT_AMBULATORY_CARE_PROVIDER_SITE_OTHER): Payer: BC Managed Care – PPO | Admitting: Internal Medicine

## 2013-09-18 ENCOUNTER — Telehealth: Payer: Self-pay | Admitting: Gynecology

## 2013-09-18 VITALS — BP 114/71 | HR 103 | Temp 99.5°F | Resp 18 | Wt 192.0 lb

## 2013-09-18 DIAGNOSIS — R509 Fever, unspecified: Secondary | ICD-10-CM

## 2013-09-18 DIAGNOSIS — R102 Pelvic and perineal pain: Secondary | ICD-10-CM

## 2013-09-18 DIAGNOSIS — D279 Benign neoplasm of unspecified ovary: Secondary | ICD-10-CM

## 2013-09-18 NOTE — Telephone Encounter (Signed)
Attempted to call to [facility], no answer.  Message to Dr. Charlies Constable to return call to office.

## 2013-09-18 NOTE — Telephone Encounter (Signed)
Spoke with patient. She is at home with her Mother, she did not go to Maternal Admissions Unit at [facility]  as instructed. Patient requests appointment with Dr. Charlies Constable for tomorrow, appointment given. Offered 0830, patient declines, accepted 1030 appointment.  Patient feels that "going to the ER is a very strong opinion and I am not sure that's what I want to do." I advised patient that if Dr. Coralyn Mark advised her to go to Maternal Admissions Unit at [facility] that the patient should follow the advice given by her, also, spoke with Dr. Sabra Heck who agrees that patient should be seen at Maternal Admissions Unit at [facility]. Patient continues to decline. States she will come in for follow up with Dr. Charlies Constable tomorrow. I advised that if temperature increases or any worsening symptoms that she should not wait and be seen at any facility that can provide emergent care if she is not close to Maternal Admissions Unit at [facility]. Patient verbalized understanding and will follow up as scheduled.   Routing to Dr. Sabra Heck.  CC: Dr. Charlies Constable

## 2013-09-18 NOTE — Telephone Encounter (Signed)
Pt is calling to talk with Dr. Brion Aliment nurse. She is experiencing a lot of pain.  Her primary doctor advised her to go to the emergency room.

## 2013-09-18 NOTE — Patient Instructions (Signed)
To [facility]

## 2013-09-18 NOTE — Progress Notes (Signed)
Subjective:    Patient ID: Julie Doyle, female    DOB: 05-30-1986, 28 y.o.   MRN: [id number]  HPI Julie Doyle is here for acute visit  Recently had repeat U/S done 2/10 revealing enlarging R ovarian cyst with some solid components.  Prior excision of cystadenoma of same ovary.   CA 125 is normal   She has been using Tramadol 100 mg po with very little relief. Cannot sit and work at home very well.  She reports running low grade fevers at home ranging 99-100.4  No cough  No dysuria, no abd pain,  No N/V/D.    Allergies  Allergen Reactions  . Ivp Dye [Iodinated Diagnostic Agents] Hives and Shortness Of Breath   Past Medical History  Diagnosis Date  . Factor 5 Leiden mutation, heterozygous   . Anxiety   . Clotting disorder   . Dysmenorrhea   . HSV-1 infection   . Blood dyscrasia     factor V  . Ovarian cyst    Past Surgical History  Procedure Laterality Date  . Orif of right fib  2011    Open Reduction of the right fibula  . Appendectomy  1998  . Wisdom tooth extraction  2012  . Cystectomy Right 01/07/2013    Laparoscopic  . Laparoscopy Right 01/07/2013    Procedure: LAPAROSCOPY OPERATIVE;  Surgeon: Azalia Bilis, MD;  Location: [facility];  Service: Gynecology;  Laterality: Right;  ovarian cystectomy  . Intrauterine device (iud) insertion N/A 01/07/2013    Procedure: INTRAUTERINE DEVICE (IUD) INSERTION;  Surgeon: Azalia Bilis, MD;  Location: [facility];  Service: Gynecology;  Laterality: N/A;   History   Social History  . Marital Status: Single    Spouse Name: N/A    Number of Children: N/A  . Years of Education: N/A   Occupational History  . Not on file.   Social History Main Topics  . Smoking status: Never Smoker   . Smokeless tobacco: Never Used  . Alcohol Use: Yes     Comment: socially   . Drug Use: No  . Sexual Activity: Yes    Birth Control/ Protection: Condom   Other Topics Concern  . Not on file   Social History Narrative  . No narrative on file    Family History  Problem Relation Age of Onset  . Hypertension Mother   . Cancer Maternal Grandmother     pancreatic  . Diabetes Maternal Grandmother   . Pancreatic cancer Maternal Grandmother   . Cancer Paternal Grandmother   . Breast cancer Paternal Grandmother   . Lung cancer Paternal Grandfather    Patient Active Problem List   Diagnosis Date Noted  . 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 Anxiety state, unspecified 11/12/2012  . Reactive depression (situational) 11/12/2012  . Dysmenorrhea 11/12/2012  . Insomnia 11/12/2012  . TMJ (dislocation of temporomandibular joint) 11/12/2012  . Factor 5 Leiden mutation, heterozygous    Current Outpatient Prescriptions on File Prior to Visit  Medication Sig Dispense Refill  . methylphenidate (CONCERTA) 54 MG CR tablet Take 54 mg by mouth every morning.      [name] (EYE ALLERGY RELIEF OP) Apply 1 drop to eye daily.      . traMADol (ULTRAM ER) 100 MG 24 hr tablet Take 1 tablet (100 mg total) by mouth daily.  10 tablet  0  . amphetamine-dextroamphetamine (ADDERALL XR) 10 MG 24 hr capsule Take 10 mg by mouth daily.      . cyclobenzaprine (  FLEXERIL) 5 MG tablet Take 1 tablet (5 mg total) by mouth 3 (three) times daily as needed for muscle spasms.  30 tablet  0  . metoCLOPramide (REGLAN) 10 MG tablet Take 1 tablet (10 mg total) by mouth every 8 (eight) hours as needed for nausea.  12 tablet  0   No current facility-administered medications on file prior to visit.       Review of Systems See HPI    Objective:   Physical Exam Physical Exam  Nursing note and vitals reviewed.  U/A  normal Constitutional: She is oriented to person, place, and time. She appears well-developed and well-nourished.  HENT:  Head: Normocephalic and atraumatic.  Cardiovascular: Normal rate and regular rhythm. Exam reveals no gallop and no friction rub.  No murmur heard.  Pulmonary/Chest: Breath sounds normal. She has no wheezes. She has no  rales. Abd  R adnexal tenderness  BS+  No peritoneal signs  Neurological: She is alert and oriented to person, place, and time.  Skin: Skin is warm and dry.  Psychiatric: She has a normal mood and affect. Her behavior is normal.        Assessment & Plan:  Pelvic pain  Enlarging R ovarian cyst:    I called Dr. Brion Aliment office but she is not available.  Pain not controlled with oral meds.  I spoke with NP at [facility]  - she needs better pain control and evaluate for repeat U/S  Low grade fever  U/A neg.  Etiology unclear.  No cough or Uri or GI symptoms   See me as needed or any worsening symptoms

## 2013-09-18 NOTE — Telephone Encounter (Signed)
Heather from Dr. Sharin Mons office called to see if Dr. Charlies Constable was available to speak with about this patient. She is in their office today as a referral from [place]. The attached callback number is Dr. Sharin Mons cell phone number and she is aware Dr. Charlies Constable is not in the office today.

## 2013-09-19 ENCOUNTER — Ambulatory Visit (INDEPENDENT_AMBULATORY_CARE_PROVIDER_SITE_OTHER): Payer: BC Managed Care – PPO | Admitting: Gynecology

## 2013-09-19 ENCOUNTER — Encounter: Payer: Self-pay | Admitting: Gynecology

## 2013-09-19 VITALS — BP 110/70 | HR 80 | Temp 98.5°F | Resp 18 | Ht 65.0 in | Wt 192.0 lb

## 2013-09-19 DIAGNOSIS — D279 Benign neoplasm of unspecified ovary: Secondary | ICD-10-CM

## 2013-09-19 DIAGNOSIS — R102 Pelvic and perineal pain: Secondary | ICD-10-CM

## 2013-09-19 DIAGNOSIS — N949 Unspecified condition associated with female genital organs and menstrual cycle: Secondary | ICD-10-CM

## 2013-09-19 DIAGNOSIS — D270 Benign neoplasm of right ovary: Secondary | ICD-10-CM

## 2013-09-19 MED ORDER — TRAMADOL HCL (ER BIPHASIC) 200 MG PO TB24: 200.0000 mg | ORAL_TABLET | Freq: Every day | ORAL | Status: DC

## 2013-09-19 NOTE — Telephone Encounter (Signed)
Dr Charlies Constable,  Is this patient to have an ultrasound on Tuesday?  Thank you, Gabriel Cirri

## 2013-09-19 NOTE — Telephone Encounter (Signed)
At check out, pt says she is suppose to have an ultrasound done on Tuesday.

## 2013-09-19 NOTE — Progress Notes (Signed)
Subjective:     Patient ID: Julie Doyle, female   DOB: [birth year], 29 y.o.   MRN: [id number]  HPI Comments: Pt here for f/u.  She still is having fevers in the 99+ range with malaise and headache.  Normal WBC, was seen by PCP who was unable to diagnose either.  Pt reports that she has been taking her  Mother's ultram ER 100mg  without relief, she is scheduled for u/s next week.  Pt denies issues with constipation  Abdominal Pain Associated symptoms include a fever, headaches and nausea. Pertinent negatives include no constipation, diarrhea, hematuria or vomiting.     Review of Systems  Constitutional: Positive for fever, chills and fatigue.  Gastrointestinal: Positive for nausea, abdominal pain and rectal pain. Negative for vomiting, diarrhea, constipation, abdominal distention and anal bleeding.  Genitourinary: Negative for hematuria, vaginal bleeding, vaginal discharge, difficulty urinating and vaginal pain.  Neurological: Positive for light-headedness and headaches.       Objective:   Physical Exam  Nursing note and vitals reviewed. Constitutional: She is oriented to person, place, and time. She appears well-developed and well-nourished.  Abdominal: Soft. Bowel sounds are normal. She exhibits no distension. There is no tenderness. There is no rebound and no guarding.  Neurological: She is alert and oriented to person, place, and time.  Skin: Skin is warm and dry.  Pelvic exam: VULVA: normal appearing vulva with no masses, tenderness or lesions, VAGINA: normal appearing vagina with normal color and discharge, no lesions, CERVIX: normal appearing cervix without discharge or lesions, cervical motion tenderness absent, UTERUS: uterus is normal size, shape, consistency and nontender, ADNEXA: tenderness right, mass present right side       Assessment:     Adnexal mass History of cystadenoma     Plan:     Pt ok to increase ultram to 200mg /d Keep PUS next week Will consider  options re cyst going forward No source for fevers, will continue to track

## 2013-09-22 NOTE — Telephone Encounter (Signed)
fyi

## 2013-09-22 NOTE — Telephone Encounter (Signed)
Patient schedule for pus 02.24.2015 @ 1200. Called patient to confirm appointment.

## 2013-09-23 ENCOUNTER — Ambulatory Visit (INDEPENDENT_AMBULATORY_CARE_PROVIDER_SITE_OTHER): Payer: BC Managed Care – PPO

## 2013-09-23 ENCOUNTER — Telehealth: Payer: Self-pay | Admitting: Internal Medicine

## 2013-09-23 ENCOUNTER — Ambulatory Visit (INDEPENDENT_AMBULATORY_CARE_PROVIDER_SITE_OTHER): Payer: BC Managed Care – PPO | Admitting: Gynecology

## 2013-09-23 VITALS — BP 114/85 | HR 84 | Temp 98.8°F | Resp 16 | Ht 65.0 in | Wt 191.0 lb

## 2013-09-23 DIAGNOSIS — R102 Pelvic and perineal pain: Secondary | ICD-10-CM

## 2013-09-23 DIAGNOSIS — R110 Nausea: Secondary | ICD-10-CM

## 2013-09-23 DIAGNOSIS — D279 Benign neoplasm of unspecified ovary: Secondary | ICD-10-CM

## 2013-09-23 DIAGNOSIS — R1031 Right lower quadrant pain: Secondary | ICD-10-CM

## 2013-09-23 DIAGNOSIS — G8929 Other chronic pain: Secondary | ICD-10-CM

## 2013-09-23 DIAGNOSIS — D270 Benign neoplasm of right ovary: Secondary | ICD-10-CM

## 2013-09-23 DIAGNOSIS — N949 Unspecified condition associated with female genital organs and menstrual cycle: Secondary | ICD-10-CM

## 2013-09-23 LAB — CBC WITH DIFFERENTIAL/PLATELET
Basophils Absolute: 0.1 10*3/uL (ref 0.0–0.1)
Basophils Relative: 1 % (ref 0–1)
Eosinophils Absolute: 0.2 10*3/uL (ref 0.0–0.7)
Eosinophils Relative: 2 % (ref 0–5)
HEMATOCRIT: 41.7 % (ref 36.0–46.0)
HEMOGLOBIN: 14.6 g/dL (ref 12.0–15.0)
LYMPHS PCT: 33 % (ref 12–46)
Lymphs Abs: 2.5 10*3/uL (ref 0.7–4.0)
MCH: 29.9 pg (ref 26.0–34.0)
MCHC: 35 g/dL (ref 30.0–36.0)
MCV: 85.5 fL (ref 78.0–100.0)
MONO ABS: 0.5 10*3/uL (ref 0.1–1.0)
MONOS PCT: 6 % (ref 3–12)
Neutro Abs: 4.5 10*3/uL (ref 1.7–7.7)
Neutrophils Relative %: 58 % (ref 43–77)
Platelets: 349 10*3/uL (ref 150–400)
RBC: 4.88 MIL/uL (ref 3.87–5.11)
RDW: 13.3 % (ref 11.5–15.5)
WBC: 7.7 10*3/uL (ref 4.0–10.5)

## 2013-09-23 LAB — AMYLASE: Amylase: 52 U/L (ref 0–105)

## 2013-09-23 LAB — COMPREHENSIVE METABOLIC PANEL
ALT: 9 U/L (ref 0–35)
AST: 17 U/L (ref 0–37)
Albumin: 4.5 g/dL (ref 3.5–5.2)
Alkaline Phosphatase: 35 U/L — ABNORMAL LOW (ref 39–117)
BUN: 11 mg/dL (ref 6–23)
CALCIUM: 9.6 mg/dL (ref 8.4–10.5)
CHLORIDE: 101 meq/L (ref 96–112)
CO2: 24 meq/L (ref 19–32)
Creat: 0.77 mg/dL (ref 0.50–1.10)
Glucose, Bld: 82 mg/dL (ref 70–99)
Potassium: 4.2 mEq/L (ref 3.5–5.3)
SODIUM: 138 meq/L (ref 135–145)
TOTAL PROTEIN: 7.1 g/dL (ref 6.0–8.3)
Total Bilirubin: 0.6 mg/dL (ref 0.2–1.2)

## 2013-09-23 LAB — LIPASE: LIPASE: 12 U/L (ref 0–75)

## 2013-09-23 MED ORDER — HYDROCODONE-ACETAMINOPHEN 7.5-325 MG PO TABS: 1.0000 | ORAL_TABLET | Freq: Four times a day (QID) | ORAL | Status: DC | PRN

## 2013-09-23 NOTE — Progress Notes (Signed)
       Pt presents with her mother for f/u u/s. She thinks she may have developed a rash from the ultram, in addition she reports still having nausea without emesis and low grade temps of 99+ but never 100.  No specific symptoms.   She still has a complex ovarian cyst on the right that is now slightly larger 5x4.1x4.4cm compared to 2w ago, the echo focus appears stable, in addition there was a slight increase in a second cystic mass 2.9x2.3cm, it has a small septation.  There is no free fluid. IUD in place. We discussed the u/s findings, she most likely has a recurrence of her cyst adenoma with possibly 2 on the right, the left ovary is normal.  We reviewed all of her past u/s for comparisons.  She has a normal Ca125.  We offered her reoperation with removal of either the ovary or the cysts again, there is a risk that she may recur due to incomplete resection with cystectomy.  We discussed the affects on fertility as well. At this point, she is leaning towards removal of the ovary, with the understanding that she can opt for cystectomy again.  We reviewed the surgical risks again as well as risks from potential scar tissue formation from the first surgery.  She is ware of the risk of conversion to open if necessary.  The risk of bleeding, infection, damage to bowel, bladder and ureter.  Anesthetic risks, DVT/PE formation.  Anticipated post-op recovery reviewed.   Questions addressed. PE:  BP 114/85  Pulse 84  Temp(Src) 98.8 F (37.1 C)  Resp 16  Ht 5\' 5"  (1.651 m)  Wt 191 lb (86.637 kg)  BMI 31.78 kg/m2  LMP 09/19/2013 General appearance: alert, cooperative and appears stated age Lungs: clear to auscultation bilaterally Heart: regular rate and rhythm, S1, S2 normal, no murmur, click, rub or gallop Abdomen: soft, non-tender; bowel sounds normal; no masses,  no organomegaly  rx for lortab 7.5mg  to take with motrin 400-600mg  as needed for pain given We will check amylase, lipase and cbc with  diff due to low grade temp and nausea, will contact with results  Total 57m spent reviewing u/s, and decision to treatment options, >50% face to face

## 2013-09-23 NOTE — Telephone Encounter (Signed)
Spoke with pt.  She is feeling a little better.  Temps running  99.2 to 99.6.  No temp is over 100.  She has follow up ultrasound scheduled for today  She gave me verbal permission to give any medical/health related information to her Stepmother Manuela Schwartz

## 2013-09-24 NOTE — Telephone Encounter (Signed)
Pt is checking to see if Julie Doyle has checked her insurance yet and does she have a date for surgery yet?

## 2013-09-26 NOTE — Telephone Encounter (Signed)
Per Dr Charlies Constable, please precert for laparoscopic ovarian cystectomy CPT [phone].

## 2013-09-26 NOTE — Telephone Encounter (Signed)
Telephoned patient to advise of patient liability for surgeons fees $752.68. Patient advised that payment should be received in full 2 weeks prior to the surgery date. Patient now waiting to be called regarding scheduling.

## 2013-09-26 NOTE — Telephone Encounter (Signed)
Pt calling to schedule surgery.

## 2013-09-26 NOTE — Telephone Encounter (Signed)
Pre-cert complete. Patient liability for surgeon fees will be $752.68.

## 2013-09-26 NOTE — Telephone Encounter (Signed)
Return call to patient, she states she is waiting to hear back about insurance precert for surgery and when we will be able to get it scheduled.  She states she does not have money for second opinion and is waiting for Dr lathrop to speak with Dr Aldean Ast about her case. Patient states that has been 3 weeks and she is still waiting on his opinion.  States if we cant do this, she will have to check on scheduling second opinion with someone else.   Patient prefers to get surgery scheduled sooner than later.  Please advise.

## 2013-09-27 ENCOUNTER — Encounter (HOSPITAL_COMMUNITY): Payer: Self-pay

## 2013-09-27 ENCOUNTER — Inpatient Hospital Stay (HOSPITAL_COMMUNITY): Payer: BC Managed Care – PPO

## 2013-09-27 ENCOUNTER — Ambulatory Visit (HOSPITAL_COMMUNITY)
Admission: AD | Admit: 2013-09-27 | Discharge: 2013-09-28 | Disposition: A | Discharge status: Home / Self Care | Payer: BC Managed Care – PPO | Source: Ambulatory Visit | Attending: Gynecology | Admitting: Gynecology

## 2013-09-27 DIAGNOSIS — N83209 Unspecified ovarian cyst, unspecified side: Secondary | ICD-10-CM | POA: Insufficient documentation

## 2013-09-27 DIAGNOSIS — Z9889 Other specified postprocedural states: Secondary | ICD-10-CM

## 2013-09-27 DIAGNOSIS — D759 Disease of blood and blood-forming organs, unspecified: Secondary | ICD-10-CM | POA: Insufficient documentation

## 2013-09-27 DIAGNOSIS — N949 Unspecified condition associated with female genital organs and menstrual cycle: Secondary | ICD-10-CM | POA: Insufficient documentation

## 2013-09-27 DIAGNOSIS — R1900 Intra-abdominal and pelvic swelling, mass and lump, unspecified site: Secondary | ICD-10-CM | POA: Diagnosis present

## 2013-09-27 DIAGNOSIS — D279 Benign neoplasm of unspecified ovary: Principal | ICD-10-CM | POA: Insufficient documentation

## 2013-09-27 DIAGNOSIS — D682 Hereditary deficiency of other clotting factors: Secondary | ICD-10-CM | POA: Insufficient documentation

## 2013-09-27 DIAGNOSIS — R1031 Right lower quadrant pain: Secondary | ICD-10-CM | POA: Insufficient documentation

## 2013-09-27 DIAGNOSIS — N83299 Other ovarian cyst, unspecified side: Secondary | ICD-10-CM

## 2013-09-27 DIAGNOSIS — Z975 Presence of (intrauterine) contraceptive device: Secondary | ICD-10-CM | POA: Insufficient documentation

## 2013-09-27 DIAGNOSIS — F341 Dysthymic disorder: Secondary | ICD-10-CM | POA: Insufficient documentation

## 2013-09-27 LAB — COMPREHENSIVE METABOLIC PANEL
ALBUMIN: 4.2 g/dL (ref 3.5–5.2)
ALK PHOS: 44 U/L (ref 39–117)
ALT: 12 U/L (ref 0–35)
AST: 19 U/L (ref 0–37)
BUN: 9 mg/dL (ref 6–23)
CALCIUM: 9.5 mg/dL (ref 8.4–10.5)
CO2: 25 mEq/L (ref 19–32)
Chloride: 100 mEq/L (ref 96–112)
Creatinine, Ser: 0.73 mg/dL (ref 0.50–1.10)
GFR calc Af Amer: >90 mL/min (ref >90)
GFR calc non Af Amer: >90 mL/min (ref >90)
Glucose, Bld: 103 mg/dL — ABNORMAL HIGH (ref 70–99)
POTASSIUM: 4 meq/L (ref 3.7–5.3)
Sodium: 140 mEq/L (ref 137–147)
Total Bilirubin: 0.5 mg/dL (ref 0.3–1.2)
Total Protein: 7.6 g/dL (ref 6.0–8.3)

## 2013-09-27 LAB — CBC
HCT: 42.7 % (ref 36.0–46.0)
Hemoglobin: 14.7 g/dL (ref 12.0–15.0)
MCH: 29.6 pg (ref 26.0–34.0)
MCHC: 34.4 g/dL (ref 30.0–36.0)
MCV: 85.9 fL (ref 78.0–100.0)
Platelets: 308 10*3/uL (ref 150–400)
RBC: 4.97 MIL/uL (ref 3.87–5.11)
RDW: 12.8 % (ref 11.5–15.5)
WBC: 9.3 10*3/uL (ref 4.0–10.5)

## 2013-09-27 LAB — URINALYSIS, ROUTINE W REFLEX MICROSCOPIC
Bilirubin Urine: NEGATIVE
Glucose, UA: NEGATIVE mg/dL
Hgb urine dipstick: NEGATIVE
KETONES UR: NEGATIVE mg/dL
LEUKOCYTES UA: NEGATIVE
Nitrite: NEGATIVE
PH: 6 (ref 5.0–8.0)
Protein, ur: NEGATIVE mg/dL
SPECIFIC GRAVITY, URINE: 1.015 (ref 1.005–1.030)
Urobilinogen, UA: 0.2 mg/dL (ref 0.0–1.0)

## 2013-09-27 LAB — MRSA PCR SCREENING: MRSA BY PCR: NEGATIVE

## 2013-09-27 LAB — POCT PREGNANCY, URINE: PREG TEST UR: NEGATIVE

## 2013-09-27 MED ORDER — KETOROLAC TROMETHAMINE 60 MG/2ML IM SOLN
60.0000 mg | Freq: Once | INTRAMUSCULAR | Status: AC
  Administered 2013-09-27: 60 mg via INTRAMUSCULAR
  Filled 2013-09-27: qty 2

## 2013-09-27 MED ORDER — SODIUM CHLORIDE 0.9 % IJ SOLN
3.0000 mL | Freq: Two times a day (BID) | INTRAMUSCULAR | Status: DC
  Administered 2013-09-28: 3 mL via INTRAVENOUS

## 2013-09-27 MED ORDER — DEXTROSE 5 % IV SOLN
2.0000 g | Freq: Once | INTRAVENOUS | Status: AC
  Administered 2013-09-28: 2 g via INTRAVENOUS
  Filled 2013-09-27: qty 2

## 2013-09-27 MED ORDER — HYDROMORPHONE HCL PF 1 MG/ML IJ SOLN
2.0000 mg | INTRAMUSCULAR | Status: DC | PRN
  Administered 2013-09-27 – 2013-09-28 (×2): 2 mg via INTRAMUSCULAR
  Filled 2013-09-27 (×2): qty 2

## 2013-09-27 MED ORDER — HYDROMORPHONE HCL PF 1 MG/ML IJ SOLN
2.0000 mg | Freq: Once | INTRAMUSCULAR | Status: AC
  Administered 2013-09-27: 2 mg via INTRAMUSCULAR
  Filled 2013-09-27: qty 2

## 2013-09-27 MED ORDER — KETOROLAC TROMETHAMINE 30 MG/ML IJ SOLN
30.0000 mg | Freq: Four times a day (QID) | INTRAMUSCULAR | Status: DC
  Administered 2013-09-27 – 2013-09-28 (×3): 30 mg via INTRAVENOUS
  Filled 2013-09-27 (×2): qty 1

## 2013-09-27 MED ORDER — PROPOFOL 10 MG/ML IV EMUL
INTRAVENOUS | Status: AC
  Filled 2013-09-27: qty 20

## 2013-09-27 MED ORDER — SODIUM CHLORIDE 0.9 % IJ SOLN: 3.0000 mL | INTRAMUSCULAR | Status: DC | PRN

## 2013-09-27 MED ORDER — ONDANSETRON HCL 4 MG/2ML IJ SOLN
INTRAMUSCULAR | Status: AC
  Filled 2013-09-27: qty 2

## 2013-09-27 MED ORDER — LIDOCAINE HCL (CARDIAC) 20 MG/ML IV SOLN
INTRAVENOUS | Status: AC
  Filled 2013-09-27: qty 5

## 2013-09-27 MED ORDER — KETOROLAC TROMETHAMINE 30 MG/ML IJ SOLN
30.0000 mg | Freq: Four times a day (QID) | INTRAMUSCULAR | Status: DC
  Filled 2013-09-27: qty 1

## 2013-09-27 MED ORDER — DEXAMETHASONE SODIUM PHOSPHATE 10 MG/ML IJ SOLN
INTRAMUSCULAR | Status: AC
  Filled 2013-09-27: qty 1

## 2013-09-27 MED ORDER — SODIUM CHLORIDE 0.9 % IV SOLN: 250.0000 mL | INTRAVENOUS | Status: DC | PRN

## 2013-09-27 MED ORDER — FENTANYL CITRATE 0.05 MG/ML IJ SOLN
INTRAMUSCULAR | Status: AC
  Filled 2013-09-27: qty 5

## 2013-09-27 MED ORDER — NEOSTIGMINE METHYLSULFATE 1 MG/ML IJ SOLN
INTRAMUSCULAR | Status: AC
  Filled 2013-09-27: qty 1

## 2013-09-27 MED ORDER — MIDAZOLAM HCL 2 MG/2ML IJ SOLN
INTRAMUSCULAR | Status: AC
  Filled 2013-09-27: qty 2

## 2013-09-27 MED ORDER — GLYCOPYRROLATE 0.2 MG/ML IJ SOLN
INTRAMUSCULAR | Status: AC
  Filled 2013-09-27: qty 4

## 2013-09-27 NOTE — H&P (Signed)
@LOGO @  Julie Doyle is an 28 y.o. female. [facility] complaining of excruciating right lower quadrant pain that started approximately 3 PM today. Review of patient's records indicated that she has been followed by Dr. Charlies Constable for recurrent ovarian cyst. In June 2014 patient had a right ovarian cystectomy for serous cystadenoma and placement of a Skyla IUD. Patient has been seen on several occasions by Dr. Elinor Dodge office at her last ultrasound had demonstrated the following:  complex ovarian cyst on the right that is now slightly larger 5x4.1x4.4cm compared to 2w ago, the echo focus appears stable, in addition there was a slight increase in a second cystic mass 2.9x2.3cm, it has a small septation. There is no free fluid.  IUD in place. The patient had a normal CA 125. Patient with history of Leiden V Factor deficiency  Patient for several weeks has complained of running a low-grade temperature less than 100. Dr. Charlies Constable had referred her to her internist and a battery of tests were done to include comprehensive metabolic panel, CBC, lipase and amylase were reported all to be normal. Patient reports a last menstrual cycle less than 2 weeks ago. Patient also has had a prior appendectomy in the past.  Pertinent Gynecological History: Menses: Regular 2 weeks ago Bleeding: Same as above Contraception: IUD DES exposure: denies Blood transfusions: none Sexually transmitted diseases: no past history Previous GYN Procedures: Laparoscopic right ovarian cystectomy  Last mammogram: Not indicated Date: not indicated Last pap: ? Date: ? OB History: G0, P0   Menstrual History: Menarche age: 27  Patient's last menstrual period was 09/19/2013.    Past Medical History  Diagnosis Date  . Factor 5 Leiden mutation, heterozygous   . Anxiety   . Clotting disorder   . Dysmenorrhea   . HSV-1 infection   . Blood dyscrasia     factor V  . Ovarian cyst     Past  Surgical History  Procedure Laterality Date  . Orif of right fib  2011    Open Reduction of the right fibula  . Appendectomy  1998  . Wisdom tooth extraction  2012  . Cystectomy Right 01/07/2013    Laparoscopic  . Laparoscopy Right 01/07/2013    Procedure: LAPAROSCOPY OPERATIVE;  Surgeon: Azalia Bilis, MD;  Location: [facility];  Service: Gynecology;  Laterality: Right;  ovarian cystectomy  . Intrauterine device (iud) insertion N/A 01/07/2013    Procedure: INTRAUTERINE DEVICE (IUD) INSERTION;  Surgeon: Azalia Bilis, MD;  Location: [facility];  Service: Gynecology;  Laterality: N/A;    Family History  Problem Relation Age of Onset  . Hypertension Mother   . Cancer Maternal Grandmother     pancreatic  . Diabetes Maternal Grandmother   . Pancreatic cancer Maternal Grandmother   . Cancer Paternal Grandmother   . Breast cancer Paternal Grandmother   . Lung cancer Paternal Grandfather     Social History:  reports that she has never smoked. She has never used smokeless tobacco. She reports that she drinks alcohol. She reports that she does not use illicit drugs.  Allergies:  Allergies  Allergen Reactions  . Ivp Dye [Iodinated Diagnostic Agents] Hives and Shortness Of Breath    Prescriptions prior to admission  Medication Sig Dispense Refill  . BIOTIN PO Take 1 tablet by mouth daily.      Marland Kitchen HYDROcodone-acetaminophen (LORTAB) 7.5-325 MG per tablet Take 1 tablet by mouth every 6 (six) hours as needed for moderate pain.  30 tablet  0  . methylphenidate (CONCERTA) 54 MG CR tablet Take 54 mg by mouth every morning.      . metoCLOPramide (REGLAN) 10 MG tablet Take 1 tablet (10 mg total) by mouth every 8 (eight) hours as needed for nausea.  12 tablet  0  . Naphazoline-Pheniramine (EYE ALLERGY RELIEF OP) Apply 1 drop to eye daily.      . TraMADol HCl (ULTRAM ER) 200 MG TB24 Take 200 mg by mouth daily.  10 tablet  0    REVIEW OF SYSTEMS: A ROS was performed and pertinent positives and negatives  are included in the history.  GENERAL: No fevers or chills. HEENT: No change in vision, no earache, sore throat or sinus congestion. NECK: No pain or stiffness. CARDIOVASCULAR: No chest pain or pressure. No palpitations. PULMONARY: No shortness of breath, cough or wheeze. GASTROINTESTINAL: No abdominal pain, nausea, vomiting or diarrhea, melena or bright red blood per rectum. GENITOURINARY: No urinary frequency, urgency, hesitancy or dysuria. MUSCULOSKELETAL: No joint or muscle pain, no back pain, no recent trauma. DERMATOLOGIC: No rash, no itching, no lesions. ENDOCRINE: No polyuria, polydipsia, no heat or cold intolerance. No recent change in weight. HEMATOLOGICAL: No anemia or easy bruising or bleeding. NEUROLOGIC: No headache, seizures, numbness, tingling or weakness. PSYCHIATRIC: No depression, no loss of interest in normal activity or change in sleep pattern.     Blood pressure 142/89, pulse 83, temperature 98.6 F (37 C), temperature source Oral, resp. rate 24, last menstrual period 09/19/2013, SpO2 97.00%.  Physical Exam:  HEENT:unremarkable Neck:Supple, midline, no thyroid megaly, no carotid bruits Lungs:  Clear to auscultation no rhonchi's or wheezes Heart:Regular rate and rhythm, no murmurs or gallops Breast Exam:not examined  Abdomen: Tender especially the right lower quadrant with rebound and guarding Pelvic:BUS not done due to the amount of pain and tenderness she was expressing on the right lower quadrant Vagina: Same as above Cervix: Same as above Uterus: Same as above Adnexa: Same as above Extremities: No cords, no edema Rectal: Not done  Ultrasound today:  CLINICAL DATA: Acute right-sided pain, evaluate for torsion  EXAM:  TRANSVAGINAL ULTRASOUND OF PELVIS  DOPPLER ULTRASOUND OF OVARIES  TECHNIQUE:  Transvaginal ultrasound examination of the pelvis was performed  including evaluation of the uterus, ovaries, adnexal regions, and  pelvic cul-de-sac.  Color and duplex  Doppler ultrasound was utilized to evaluate blood  flow to the ovaries.  COMPARISON: [facility] pelvic ultrasound dated  09/23/2013  FINDINGS:  Uterus  Measurements: 7.8 x 3.6 x 4.5 cm. No fibroids or other mass  visualized.  Endometrium  Poorly visualized. IUD in satisfactory position.  Right ovary  Measurements: 7.9 x 4.9 x 3.8 cm. Dominant 4.6 x 4.5 x 4.5 cm  predominantly simple cyst but with possible 16 x 13 mm peripheral  mural nodule (image 20). Additional 2.8 x 2.9 x 2.6 cm  complex/septated cyst.  Left ovary  Measurements: 3.4 x 2.1 x 4.0 cm. Normal appearance/no adnexal mass.  Pulsed Doppler evaluation demonstrates normal low-resistance  arterial and venous waveforms in both ovaries.  Additional comments: Trace pelvic fluid.   IMPRESSION:  No evidence of ovarian torsion.  4.6 cm cystic right ovarian lesion with possible mural nodule.  Additional 2.9 cm complex/septated right ovarian cyst. Despite the  appearance, these findings are likely physiologic given the  patient's age, and follow-up pelvic ultrasound is suggested in 6-10  weeks. If persistent, consider pelvic MRI with/without contrast for  further evaluation.  IUD in  satisfactory position.  Electronically Signed  By: Julian Hy M.D.  On: 09/27/2013 17:31   Results for orders placed during the hospital encounter of 09/27/13 (from the past 24 hour(s))  CBC     Status: None   Collection Time    09/27/13  4:22 PM      Result Value Ref Range   WBC 9.3  4.0 - 10.5 K/uL   RBC 4.97  3.87 - 5.11 MIL/uL   Hemoglobin 14.7  12.0 - 15.0 g/dL   HCT 42.7  36.0 - 46.0 %   MCV 85.9  78.0 - 100.0 fL   MCH 29.6  26.0 - 34.0 pg   MCHC 34.4  30.0 - 36.0 g/dL   RDW 12.8  11.5 - 15.5 %   Platelets 308  150 - 400 K/uL  COMPREHENSIVE METABOLIC PANEL     Status: Abnormal   Collection Time    09/27/13  4:22 PM      Result Value Ref Range   Sodium 140  137 - 147 mEq/L   Potassium 4.0  3.7 - 5.3  mEq/L   Chloride 100  96 - 112 mEq/L   CO2 25  19 - 32 mEq/L   Glucose, Bld 103 (*) 70 - 99 mg/dL   BUN 9  6 - 23 mg/dL   Creatinine, Ser 0.73  0.50 - 1.10 mg/dL   Calcium 9.5  8.4 - 10.5 mg/dL   Total Protein 7.6  6.0 - 8.3 g/dL   Albumin 4.2  3.5 - 5.2 g/dL   AST 19  0 - 37 U/L   ALT 12  0 - 35 U/L   Alkaline Phosphatase 44  39 - 117 U/L   Total Bilirubin 0.5  0.3 - 1.2 mg/dL   GFR calc non Af Amer >90  >90 mL/min   GFR calc Af Amer >90  >90 mL/min  URINALYSIS, ROUTINE W REFLEX MICROSCOPIC     Status: None   Collection Time    09/27/13  4:30 PM      Result Value Ref Range   Color, Urine YELLOW  YELLOW   APPearance CLEAR  CLEAR   Specific Gravity, Urine 1.015  1.005 - 1.030   pH 6.0  5.0 - 8.0   Glucose, UA NEGATIVE  NEGATIVE mg/dL   Hgb urine dipstick NEGATIVE  NEGATIVE   Bilirubin Urine NEGATIVE  NEGATIVE   Ketones, ur NEGATIVE  NEGATIVE mg/dL   Protein, ur NEGATIVE  NEGATIVE mg/dL   Urobilinogen, UA 0.2  0.0 - 1.0 mg/dL   Nitrite NEGATIVE  NEGATIVE   Leukocytes, UA NEGATIVE  NEGATIVE  POCT PREGNANCY, URINE     Status: None   Collection Time    09/27/13  5:29 PM      Result Value Ref Range   Preg Test, Ur NEGATIVE  NEGATIVE    US Transvaginal Non-ob  09/27/2013   CLINICAL DATA:  Acute right-sided pain, evaluate for torsion  EXAM: TRANSVAGINAL ULTRASOUND OF PELVIS  DOPPLER ULTRASOUND OF OVARIES  TECHNIQUE: Transvaginal ultrasound examination of the pelvis was performed including evaluation of the uterus, ovaries, adnexal regions, and pelvic cul-de-sac.  Color and duplex Doppler ultrasound was utilized to evaluate blood flow to the ovaries.  COMPARISON:  [facility] pelvic ultrasound dated 09/23/2013  FINDINGS: Uterus  Measurements: 7.8 x 3.6 x 4.5 cm. No fibroids or other mass visualized.  Endometrium  Poorly visualized.  IUD in satisfactory position.  Right ovary  Measurements: 7.9 x 4.9 x 3.8  cm. Dominant 4.6 x 4.5 x 4.5 cm predominantly simple cyst but  with possible 16 x 13 mm peripheral mural nodule (image 20). Additional 2.8 x 2.9 x 2.6 cm complex/septated cyst.  Left ovary  Measurements: 3.4 x 2.1 x 4.0 cm. Normal appearance/no adnexal mass.  Pulsed Doppler evaluation demonstrates normal low-resistance arterial and venous waveforms in both ovaries.  Additional comments: Trace pelvic fluid.  IMPRESSION: No evidence of ovarian torsion.  4.6 cm cystic right ovarian lesion with possible mural nodule. Additional 2.9 cm complex/septated right ovarian cyst. Despite the appearance, these findings are likely physiologic given the patient's age, and follow-up pelvic ultrasound is suggested in 6-10 weeks. If persistent, consider pelvic MRI with/without contrast for further evaluation.  IUD in satisfactory position.   Electronically Signed   By: Julian Hy M.D.   On: 09/27/2013 17:31    Assessment/Plan: Patient with history of right ovarian cystadenoma in 2014 had undergone a right ovarian cystectomy and placement of IUD at the same time. Patient with recurrent ovarian cysts has enlarged over the past several weeks. Due to the acute nature of patient's pain and concerns about torsion (although blood flow reported to ovaries on ultrasound) we had discussed proceeding with laparoscopic right ovarian cystectomy, possible right salpingo-oophorectomy, possible laparotomy. I contacted her primary physician Dr. Charlies Constable and the case was discussed with her as well as the patient and mother who were present. Options were presented to her were:  to admit overnight for pain management and reassess in the morning or  to proceed with surgery tonight. The following risks for surgery were discussed: The following risks were discussed in reference to surgery:                        Patient was counseled as to the risk of surgery to include the following:  1. Infection (prohylactic antibiotics will be administered)  2. DVT/Pulmonary Embolism (prophylactic pneumo compression  stockings will be used)  3.Trauma to internal organs requiring additional surgical procedure to repair any injury to     Internal organs requiring perhaps additional hospitalization days.  4.Hemmorhage requiring transfusion and blood products which carry risks such as             anaphylactic reaction, hepatitis and AIDS  Patient had received literature information on the procedure scheduled and all her questions were answered and fully accepts all risk.  Patient has decided to wait to the am for surgery and have pain management tonight.     [facility] HMD6:47 PMTD@Note : This dictation was prepared with  Dragon/digital dictation along withSmart phrase technology. Any transcriptional errors that result from this process are unintentional.     Philis Fendt 09/27/2013, 6:32 PM  Note: This dictation was prepared with  Dragon/digital dictation along withSmart phrase technology. Any transcriptional errors that result from this process are unintentional.

## 2013-09-27 NOTE — MAU Note (Signed)
Pt states has had r ovarian cyst for 3-4 months with no issues. 30-40 minutes ago began having intense pain on r side only that is constant. Denies bleeding or vag d/c changes.

## 2013-09-27 NOTE — MAU Provider Note (Signed)
History     CSN: [id number]  Arrival date and time: 09/27/13 1551  Provider at bedside at 1604    Chief Complaint  Patient presents with  . Ovarian Cyst   HPI Julie Doyle 28 y.o. Comes to MAU crying and holding right side.  Brought to MAU exam room via wheelchair.  Thinks she has an ovarian cyst that has ruptured.  Began having acute pain at home while cooking.  Mother brought her immediately to the hospital.  Client has been seen by Dr. Charlies Constable on 09-23-13 and was in the process of being scheduled for surgery for right ovarian cyst.  Hx of previous surgery for an ovarian cyst.  Uses IUD for contraception.  LMP 09-19-13.    OB History   Grav Para Term Preterm Abortions TAB SAB Ect Mult Living   0 0 0 0 0 0 0 0 0 0       Past Medical History  Diagnosis Date  . Factor 5 Leiden mutation, heterozygous   . Anxiety   . Clotting disorder   . Dysmenorrhea   . HSV-1 infection   . Blood dyscrasia     factor V  . Ovarian cyst     Past Surgical History  Procedure Laterality Date  . Orif of right fib  2011    Open Reduction of the right fibula  . Appendectomy  1998  . Wisdom tooth extraction  2012  . Cystectomy Right 01/07/2013    Laparoscopic  . Laparoscopy Right 01/07/2013    Procedure: LAPAROSCOPY OPERATIVE;  Surgeon: Azalia Bilis, MD;  Location: [facility];  Service: Gynecology;  Laterality: Right;  ovarian cystectomy  . Intrauterine device (iud) insertion N/A 01/07/2013    Procedure: INTRAUTERINE DEVICE (IUD) INSERTION;  Surgeon: Azalia Bilis, MD;  Location: [facility];  Service: Gynecology;  Laterality: N/A;    Family History  Problem Relation Age of Onset  . Hypertension Mother   . Cancer Maternal Grandmother     pancreatic  . Diabetes Maternal Grandmother   . Pancreatic cancer Maternal Grandmother   . Cancer Paternal Grandmother   . Breast cancer Paternal Grandmother   . Lung cancer Paternal Grandfather     History  Substance Use Topics  . Smoking status: Never  Smoker   . Smokeless tobacco: Never Used  . Alcohol Use: Yes     Comment: socially     Allergies:  Allergies  Allergen Reactions  . Ivp Dye [Iodinated Diagnostic Agents] Hives and Shortness Of Breath    Prescriptions prior to admission  Medication Sig Dispense Refill  . BIOTIN PO Take 1 tablet by mouth daily.      Marland Kitchen HYDROcodone-acetaminophen (LORTAB) 7.5-325 MG per tablet Take 1 tablet by mouth every 6 (six) hours as needed for moderate pain.  30 tablet  0  . methylphenidate (CONCERTA) 54 MG CR tablet Take 54 mg by mouth every morning.      . metoCLOPramide (REGLAN) 10 MG tablet Take 1 tablet (10 mg total) by mouth every 8 (eight) hours as needed for nausea.  12 tablet  0  . Naphazoline-Pheniramine (EYE ALLERGY RELIEF OP) Apply 1 drop to eye daily.      . TraMADol HCl (ULTRAM ER) 200 MG TB24 Take 200 mg by mouth daily.  10 tablet  0    Review of Systems  Constitutional: Negative for fever.  Gastrointestinal: Positive for abdominal pain. Negative for nausea and vomiting.  Genitourinary:       No vaginal bleeding  Physical Exam   Blood pressure 142/89, pulse 83, temperature 98.6 F (37 C), temperature source Oral, resp. rate 24, last menstrual period 09/19/2013, SpO2 97.00%.  Physical Exam  Nursing note and vitals reviewed. Constitutional: She is oriented to person, place, and time. She appears well-developed and well-nourished. She appears distressed.  Crying and holding RLQ.  HENT:  Head: Normocephalic.  Eyes: EOM are normal.  Neck: Neck supple.  GI:  Unable to fully evaluate as client is in distress  Musculoskeletal: Normal range of motion.  Neurological: She is alert and oriented to person, place, and time.  Skin: Skin is warm and dry.  Psychiatric: She has a normal mood and affect.    MAU Course  Procedures CLINICAL DATA: Acute right-sided pain, evaluate for torsion  EXAM:  TRANSVAGINAL ULTRASOUND OF PELVIS  DOPPLER ULTRASOUND OF OVARIES  TECHNIQUE:   Transvaginal ultrasound examination of the pelvis was performed  including evaluation of the uterus, ovaries, adnexal regions, and  pelvic cul-de-sac.  Color and duplex Doppler ultrasound was utilized to evaluate blood  flow to the ovaries.  COMPARISON: [facility] pelvic ultrasound dated  09/23/2013  FINDINGS:  Uterus  Measurements: 7.8 x 3.6 x 4.5 cm. No fibroids or other mass  visualized.  Endometrium  Poorly visualized. IUD in satisfactory position.  Right ovary  Measurements: 7.9 x 4.9 x 3.8 cm. Dominant 4.6 x 4.5 x 4.5 cm  predominantly simple cyst but with possible 16 x 13 mm peripheral  mural nodule (image 20). Additional 2.8 x 2.9 x 2.6 cm  complex/septated cyst.  Left ovary  Measurements: 3.4 x 2.1 x 4.0 cm. Normal appearance/no adnexal mass.  Pulsed Doppler evaluation demonstrates normal low-resistance  arterial and venous waveforms in both ovaries.  Additional comments: Trace pelvic fluid.  IMPRESSION:  No evidence of ovarian torsion.  4.6 cm cystic right ovarian lesion with possible mural nodule.  Additional 2.9 cm complex/septated right ovarian cyst. Despite the  appearance, these findings are likely physiologic given the  patient's age, and follow-up pelvic ultrasound is suggested in 6-10  weeks. If persistent, consider pelvic MRI with/without contrast for  further evaluation.  IUD in satisfactory position.   Results for orders placed during the hospital encounter of 09/27/13 (from the past 24 hour(s))  CBC     Status: None   Collection Time    09/27/13  4:22 PM      Result Value Ref Range   WBC 9.3  4.0 - 10.5 K/uL   RBC 4.97  3.87 - 5.11 MIL/uL   Hemoglobin 14.7  12.0 - 15.0 g/dL   HCT 42.7  36.0 - 46.0 %   MCV 85.9  78.0 - 100.0 fL   MCH 29.6  26.0 - 34.0 pg   MCHC 34.4  30.0 - 36.0 g/dL   RDW 12.8  11.5 - 15.5 %   Platelets 308  150 - 400 K/uL  COMPREHENSIVE METABOLIC PANEL     Status: Abnormal   Collection Time    09/27/13   4:22 PM      Result Value Ref Range   Sodium 140  137 - 147 mEq/L   Potassium 4.0  3.7 - 5.3 mEq/L   Chloride 100  96 - 112 mEq/L   CO2 25  19 - 32 mEq/L   Glucose, Bld 103 (*) 70 - 99 mg/dL   BUN 9  6 - 23 mg/dL   Creatinine, Ser 0.73  0.50 - 1.10 mg/dL   Calcium 9.5  8.4 -  10.5 mg/dL   Total Protein 7.6  6.0 - 8.3 g/dL   Albumin 4.2  3.5 - 5.2 g/dL   AST 19  0 - 37 U/L   ALT 12  0 - 35 U/L   Alkaline Phosphatase 44  39 - 117 U/L   Total Bilirubin 0.5  0.3 - 1.2 mg/dL   GFR calc non Af Amer >90  >90 mL/min   GFR calc Af Amer >90  >90 mL/min  URINALYSIS, ROUTINE W REFLEX MICROSCOPIC     Status: None   Collection Time    09/27/13  4:30 PM      Result Value Ref Range   Color, Urine YELLOW  YELLOW   APPearance CLEAR  CLEAR   Specific Gravity, Urine 1.015  1.005 - 1.030   pH 6.0  5.0 - 8.0   Glucose, UA NEGATIVE  NEGATIVE mg/dL   Hgb urine dipstick NEGATIVE  NEGATIVE   Bilirubin Urine NEGATIVE  NEGATIVE   Ketones, ur NEGATIVE  NEGATIVE mg/dL   Protein, ur NEGATIVE  NEGATIVE mg/dL   Urobilinogen, UA 0.2  0.0 - 1.0 mg/dL   Nitrite NEGATIVE  NEGATIVE   Leukocytes, UA NEGATIVE  NEGATIVE  POCT PREGNANCY, URINE     Status: None   Collection Time    09/27/13  5:29 PM      Result Value Ref Range   Preg Test, Ur NEGATIVE  NEGATIVE    MDM Pain medication ordered. Dr. Toney Rakes paged. Ultrasound ordered to verify rupture of cyst vs. Torsion of ovary. [place] with Dr. Toney Rakes  Assessment and Plan  Dr Toney Rakes in to see client.  BURLESON,TERRI 09/27/2013, 4:06 PM

## 2013-09-28 ENCOUNTER — Other Ambulatory Visit: Payer: Self-pay | Admitting: Gynecology

## 2013-09-28 ENCOUNTER — Encounter (HOSPITAL_COMMUNITY): Payer: BC Managed Care – PPO | Admitting: Anesthesiology

## 2013-09-28 ENCOUNTER — Inpatient Hospital Stay (HOSPITAL_COMMUNITY): Payer: BC Managed Care – PPO | Admitting: Anesthesiology

## 2013-09-28 ENCOUNTER — Encounter (HOSPITAL_COMMUNITY)
Admission: AD | Disposition: A | Discharge status: Home / Self Care | Payer: Self-pay | Source: Ambulatory Visit | Attending: Gynecology

## 2013-09-28 ENCOUNTER — Encounter (HOSPITAL_COMMUNITY): Payer: Self-pay | Admitting: Anesthesiology

## 2013-09-28 DIAGNOSIS — Z9889 Other specified postprocedural states: Secondary | ICD-10-CM

## 2013-09-28 DIAGNOSIS — D279 Benign neoplasm of unspecified ovary: Secondary | ICD-10-CM

## 2013-09-28 DIAGNOSIS — D270 Benign neoplasm of right ovary: Secondary | ICD-10-CM

## 2013-09-28 DIAGNOSIS — N736 Female pelvic peritoneal adhesions (postinfective): Secondary | ICD-10-CM

## 2013-09-28 HISTORY — PX: LAPAROSCOPIC OVARIAN CYSTECTOMY: SHX6248

## 2013-09-28 HISTORY — DX: Benign neoplasm of right ovary: D27.0

## 2013-09-28 LAB — CBC
HCT: 36.9 % (ref 36.0–46.0)
Hemoglobin: 12.8 g/dL (ref 12.0–15.0)
MCH: 29.9 pg (ref 26.0–34.0)
MCHC: 34.7 g/dL (ref 30.0–36.0)
MCV: 86.2 fL (ref 78.0–100.0)
PLATELETS: 244 10*3/uL (ref 150–400)
RBC: 4.28 MIL/uL (ref 3.87–5.11)
RDW: 12.6 % (ref 11.5–15.5)
WBC: 9.1 10*3/uL (ref 4.0–10.5)

## 2013-09-28 SURGERY — EXCISION, CYST, OVARY, LAPAROSCOPIC
Anesthesia: General | Laterality: Right

## 2013-09-28 MED ORDER — ROPIVACAINE HCL 5 MG/ML IJ SOLN
INTRAMUSCULAR | Status: AC
  Filled 2013-09-28: qty 30

## 2013-09-28 MED ORDER — FENTANYL CITRATE 0.05 MG/ML IJ SOLN: 50.0000 ug | INTRAMUSCULAR | Status: DC | PRN

## 2013-09-28 MED ORDER — LIDOCAINE HCL (CARDIAC) 20 MG/ML IV SOLN
INTRAVENOUS | Status: DC | PRN
  Administered 2013-09-28: 30 mg via INTRAVENOUS

## 2013-09-28 MED ORDER — METOCLOPRAMIDE HCL 10 MG PO TABS: 10.0000 mg | ORAL_TABLET | Freq: Three times a day (TID) | ORAL | Status: DC | PRN

## 2013-09-28 MED ORDER — PROPOFOL 10 MG/ML IV BOLUS
INTRAVENOUS | Status: DC | PRN
  Administered 2013-09-28: 200 mg via INTRAVENOUS

## 2013-09-28 MED ORDER — METOCLOPRAMIDE HCL 5 MG/ML IJ SOLN: 10.0000 mg | Freq: Once | INTRAMUSCULAR | Status: DC | PRN

## 2013-09-28 MED ORDER — GLYCOPYRROLATE 0.2 MG/ML IJ SOLN
INTRAMUSCULAR | Status: DC | PRN
  Administered 2013-09-28: .8 mg via INTRAVENOUS

## 2013-09-28 MED ORDER — SODIUM CHLORIDE 0.9 % IV SOLN: 250.0000 mL | INTRAVENOUS | Status: DC | PRN

## 2013-09-28 MED ORDER — ONDANSETRON HCL 4 MG/2ML IJ SOLN
INTRAMUSCULAR | Status: DC | PRN
  Administered 2013-09-28: 4 mg via INTRAVENOUS

## 2013-09-28 MED ORDER — KETOROLAC TROMETHAMINE 30 MG/ML IJ SOLN
INTRAMUSCULAR | Status: AC
  Filled 2013-09-28: qty 1

## 2013-09-28 MED ORDER — FENTANYL CITRATE 0.05 MG/ML IJ SOLN
INTRAMUSCULAR | Status: AC
  Filled 2013-09-28: qty 2

## 2013-09-28 MED ORDER — SODIUM CHLORIDE 0.9 % IJ SOLN
INTRAMUSCULAR | Status: AC
  Filled 2013-09-28: qty 50

## 2013-09-28 MED ORDER — SIMETHICONE 80 MG PO CHEW
80.0000 mg | CHEWABLE_TABLET | Freq: Four times a day (QID) | ORAL | Status: DC | PRN
  Administered 2013-09-28: 80 mg via ORAL
  Filled 2013-09-28: qty 1

## 2013-09-28 MED ORDER — DEXAMETHASONE SODIUM PHOSPHATE 10 MG/ML IJ SOLN
INTRAMUSCULAR | Status: DC | PRN
  Administered 2013-09-28: 5 mg via INTRAVENOUS

## 2013-09-28 MED ORDER — OXYCODONE-ACETAMINOPHEN 5-325 MG PO TABS: 2.0000 | ORAL_TABLET | Freq: Four times a day (QID) | ORAL | Status: DC | PRN

## 2013-09-28 MED ORDER — BUPIVACAINE HCL (PF) 0.25 % IJ SOLN
INTRAMUSCULAR | Status: AC
  Filled 2013-09-28: qty 30

## 2013-09-28 MED ORDER — BUPIVACAINE HCL (PF) 0.5 % IJ SOLN
INTRAMUSCULAR | Status: AC
  Filled 2013-09-28: qty 30

## 2013-09-28 MED ORDER — FENTANYL CITRATE 0.05 MG/ML IJ SOLN
INTRAMUSCULAR | Status: DC | PRN
  Administered 2013-09-28: 25 ug via INTRAVENOUS
  Administered 2013-09-28: 100 ug via INTRAVENOUS
  Administered 2013-09-28: 50 ug via INTRAVENOUS
  Administered 2013-09-28: 25 ug via INTRAVENOUS
  Administered 2013-09-28: 50 ug via INTRAVENOUS
  Administered 2013-09-28: 100 ug via INTRAVENOUS
  Administered 2013-09-28: 50 ug via INTRAVENOUS

## 2013-09-28 MED ORDER — LACTATED RINGERS IV SOLN
INTRAVENOUS | Status: DC | PRN
  Administered 2013-09-28 (×2): via INTRAVENOUS

## 2013-09-28 MED ORDER — FENTANYL CITRATE 0.05 MG/ML IJ SOLN
INTRAMUSCULAR | Status: DC
Start: 2013-09-28 — End: 2013-09-28
  Filled 2013-09-28: qty 2

## 2013-09-28 MED ORDER — NEOSTIGMINE METHYLSULFATE 1 MG/ML IJ SOLN
INTRAMUSCULAR | Status: DC | PRN
Start: 2013-09-28 — End: 2013-09-28
  Administered 2013-09-28: 4 mg via INTRAVENOUS

## 2013-09-28 MED ORDER — FENTANYL CITRATE 0.05 MG/ML IJ SOLN
25.0000 ug | INTRAMUSCULAR | Status: AC | PRN
  Administered 2013-09-28 (×6): 50 ug via INTRAVENOUS

## 2013-09-28 MED ORDER — DIPHENHYDRAMINE HCL 25 MG PO CAPS
25.0000 mg | ORAL_CAPSULE | Freq: Four times a day (QID) | ORAL | Status: DC | PRN
  Administered 2013-09-28: 25 mg via ORAL
  Filled 2013-09-28: qty 1

## 2013-09-28 MED ORDER — MEPERIDINE HCL 25 MG/ML IJ SOLN: 6.2500 mg | INTRAMUSCULAR | Status: DC | PRN

## 2013-09-28 MED ORDER — ROCURONIUM BROMIDE 100 MG/10ML IV SOLN
INTRAVENOUS | Status: DC | PRN
  Administered 2013-09-28 (×3): 10 mg via INTRAVENOUS
  Administered 2013-09-28: 30 mg via INTRAVENOUS

## 2013-09-28 MED ORDER — MIDAZOLAM HCL 2 MG/2ML IJ SOLN
INTRAMUSCULAR | Status: DC | PRN
  Administered 2013-09-28: 2 mg via INTRAVENOUS

## 2013-09-28 MED ORDER — ROPIVACAINE HCL 5 MG/ML IJ SOLN
INTRAMUSCULAR | Status: DC | PRN
  Administered 2013-09-28: 30 mL via EPIDURAL

## 2013-09-28 MED ORDER — OXYCODONE-ACETAMINOPHEN 5-325 MG PO TABS
1.0000 | ORAL_TABLET | ORAL | Status: DC | PRN
  Administered 2013-09-28: 2 via ORAL
  Filled 2013-09-28: qty 2

## 2013-09-28 MED ORDER — FENTANYL CITRATE 0.05 MG/ML IJ SOLN
INTRAMUSCULAR | Status: AC
  Filled 2013-09-28: qty 5

## 2013-09-28 MED ORDER — BUTORPHANOL TARTRATE 1 MG/ML IJ SOLN: 1.0000 mg | INTRAMUSCULAR | Status: DC | PRN

## 2013-09-28 SURGICAL SUPPLY — 34 items
ADH SKN CLS APL DERMABOND .7 (GAUZE/BANDAGES/DRESSINGS) ×1
BAG SPEC RTRVL LRG 6X4 10 (ENDOMECHANICALS) ×3
BLADE 15 SAFETY STRL DISP (BLADE) ×2 IMPLANT
COVER MAYO STAND STRL (DRAPES) ×2 IMPLANT
DERMABOND ADVANCED (GAUZE/BANDAGES/DRESSINGS) ×2
DERMABOND ADVANCED .7 DNX12 (GAUZE/BANDAGES/DRESSINGS) IMPLANT
DRSG COVADERM PLUS 2X2 (GAUZE/BANDAGES/DRESSINGS) ×2 IMPLANT
GLOVE BIOGEL M 6.5 STRL (GLOVE) ×6 IMPLANT
GLOVE BIOGEL PI IND STRL 6.5 (GLOVE) IMPLANT
GLOVE BIOGEL PI IND STRL 8 (GLOVE) IMPLANT
GLOVE BIOGEL PI INDICATOR 6.5 (GLOVE) ×4
GLOVE BIOGEL PI INDICATOR 8 (GLOVE) ×2
GLOVE ECLIPSE 7.5 STRL STRAW (GLOVE) ×6 IMPLANT
GOWN STRL REUS W/TWL XL LVL3 (GOWN DISPOSABLE) ×6 IMPLANT
NDL SAFETY ECLIPSE 18X1.5 (NEEDLE) IMPLANT
NDL SPNL 22GX3.5 QUINCKE BK (NEEDLE) IMPLANT
NEEDLE HYPO 18GX1.5 SHARP (NEEDLE) ×3
NEEDLE SPNL 22GX3.5 QUINCKE BK (NEEDLE) ×3 IMPLANT
PACK LAPAROSCOPY BASIN (CUSTOM PROCEDURE TRAY) ×2 IMPLANT
POUCH SPECIMEN RETRIEVAL 10MM (ENDOMECHANICALS) ×6 IMPLANT
SCISSORS LAP 5X35 DISP (ENDOMECHANICALS) ×2 IMPLANT
SET IRRIG TUBING LAPAROSCOPIC (IRRIGATION / IRRIGATOR) ×2 IMPLANT
SOLUTION ELECTROLUBE (MISCELLANEOUS) ×2 IMPLANT
SPONGE GAUZE 2X2 8PLY STER LF (GAUZE/BANDAGES/DRESSINGS) ×1
SPONGE GAUZE 2X2 8PLY STRL LF (GAUZE/BANDAGES/DRESSINGS) ×1 IMPLANT
SUT VIC AB 3-0 PS2 18 (SUTURE) ×3
SUT VIC AB 3-0 PS2 18XBRD (SUTURE) IMPLANT
SUT VICRYL 0 UR6 27IN ABS (SUTURE) ×2 IMPLANT
SYR 30ML LL (SYRINGE) ×2 IMPLANT
TOWEL NATURAL 6PK STERILE (DISPOSABLE) ×4 IMPLANT
TRAY FOLEY BAG SILVER LF 14FR (CATHETERS) ×2 IMPLANT
TROCAR XCEL NON-BLD 11X100MML (ENDOMECHANICALS) ×2 IMPLANT
TROCAR XCEL NON-BLD 5MMX100MML (ENDOMECHANICALS) ×2 IMPLANT
TROCAR XCEL OPT SLVE 5M 100M (ENDOMECHANICALS) ×2 IMPLANT

## 2013-09-28 NOTE — Anesthesia Postprocedure Evaluation (Signed)
  Anesthesia Post-op Note  Patient: Julie Doyle  Procedure(s) Performed: Procedure(s): LAPAROSCOPIC RIGHT OOPHORECTOMY (Right)  Patient Location: PACU  Anesthesia Type:General  Level of Consciousness: awake, alert  and oriented  Airway and Oxygen Therapy: Patient Spontanous Breathing  Post-op Pain: mild  Post-op Assessment: Post-op Vital signs reviewed, Patient's Cardiovascular Status Stable, Respiratory Function Stable, Patent Airway, No signs of Nausea or vomiting, Adequate PO intake and Pain level not controlled  Post-op Vital Signs: Reviewed and stable  Complications: No apparent anesthesia complications

## 2013-09-28 NOTE — H&P (View-Only) (Signed)
       Pt presents with her mother for f/u u/s. She thinks she may have developed a rash from the ultram, in addition she reports still having nausea without emesis and low grade temps of 99+ but never 100.  No specific symptoms.   She still has a complex ovarian cyst on the right that is now slightly larger 5x4.1x4.4cm compared to 2w ago, the echo focus appears stable, in addition there was a slight increase in a second cystic mass 2.9x2.3cm, it has a small septation.  There is no free fluid. IUD in place. We discussed the u/s findings, she most likely has a recurrence of her cyst adenoma with possibly 2 on the right, the left ovary is normal.  We reviewed all of her past u/s for comparisons.  She has a normal Ca125.  We offered her reoperation with removal of either the ovary or the cysts again, there is a risk that she may recur due to incomplete resection with cystectomy.  We discussed the affects on fertility as well. At this point, she is leaning towards removal of the ovary, with the understanding that she can opt for cystectomy again.  We reviewed the surgical risks again as well as risks from potential scar tissue formation from the first surgery.  She is ware of the risk of conversion to open if necessary.  The risk of bleeding, infection, damage to bowel, bladder and ureter.  Anesthetic risks, DVT/PE formation.  Anticipated post-op recovery reviewed.   Questions addressed. PE:  BP 114/85  Pulse 84  Temp(Src) 98.8 F (37.1 C)  Resp 16  Ht 5' 5" (1.651 m)  Wt 191 lb (86.637 kg)  BMI 31.78 kg/m2  LMP 09/19/2013 General appearance: alert, cooperative and appears stated age Lungs: clear to auscultation bilaterally Heart: regular rate and rhythm, S1, S2 normal, no murmur, click, rub or gallop Abdomen: soft, non-tender; bowel sounds normal; no masses,  no organomegaly  rx for lortab 7.5mg to take with motrin 400-600mg as needed for pain given We will check amylase, lipase and cbc with  diff due to low grade temp and nausea, will contact with results  Total 40m spent reviewing u/s, and decision to treatment options, >50% face to face   

## 2013-09-28 NOTE — Interval H&P Note (Signed)
History and Physical Interval Note:  09/28/2013 10:51 AM  Julie Doyle  has presented today for surgery, with the diagnosis of Right Ovarian Cyst  The various methods of treatment have been discussed with the patient and family. After consideration of risks, benefits and other options for treatment, the patient has consented to  Procedure(s): LAPAROSCOPIC RIGHT OOPHORECTOMY (Right) as a surgical intervention .  The patient's history has been reviewed, patient examined, no change in status, stable for surgery.  I have reviewed the patient's chart and labs.  Questions were answered to the patient's satisfaction.     Canton Yearby H

## 2013-09-28 NOTE — Anesthesia Preprocedure Evaluation (Addendum)
Anesthesia Evaluation  Patient identified by MRN, date of birth, ID band Patient awake    Reviewed: Allergy & Precautions, H&P , NPO status , Patient's Chart, lab work & pertinent test results  Airway Mallampati: I TM Distance: >3 FB Neck ROM: Full    Dental no notable dental hx. (+) Teeth Intact   Pulmonary neg pulmonary ROS,  breath sounds clear to auscultation  Pulmonary exam normal       Cardiovascular negative cardio ROS  Rhythm:Regular Rate:Normal     Neuro/Psych PSYCHIATRIC DISORDERS Anxiety Depression negative neurological ROS     GI/Hepatic negative GI ROS, Neg liver ROS,   Endo/Other    Renal/GU negative Renal ROS  negative genitourinary   Musculoskeletal negative musculoskeletal ROS (+)   Abdominal   Peds  Hematology  (+) Blood dyscrasia, , Factor V Leiden Mutation   Anesthesia Other Findings   Reproductive/Obstetrics Pelvic pain Suspected Right ovarian torsion                          Anesthesia Physical Anesthesia Plan  ASA: II and emergent  Anesthesia Plan: General   Post-op Pain Management:    Induction: Intravenous, Rapid sequence and Cricoid pressure planned  Airway Management Planned: Oral ETT  Additional Equipment:   Intra-op Plan:   Post-operative Plan: Extubation in OR  Informed Consent: I have reviewed the patients History and Physical, chart, labs and discussed the procedure including the risks, benefits and alternatives for the proposed anesthesia with the patient or authorized representative who has indicated his/her understanding and acceptance.   Dental advisory given  Plan Discussed with: CRNA, Anesthesiologist and Surgeon  Anesthesia Plan Comments:         Anesthesia Quick Evaluation

## 2013-09-28 NOTE — Progress Notes (Signed)
Discharge instructions reviewed with patient.  Patient states understanding of home care.  No home equipment needed.  Patient ambulated for discharge in stable condition with staff without incident.

## 2013-09-28 NOTE — Progress Notes (Signed)
Subjective: Patient reports C/o of worsening RLQ pain and pruritus Objective: I have reviewed patient's vital signs, intake and output, medications and labs.  Filed Vitals:   09/28/13 0600  BP: 108/57  Pulse: 87  Temp: 98.1 F (36.7 C)  Resp: 20    02/28 0701 - 03/01 0700 In: 150 [P.O.:150] Out: 600 [Urine:600]      Lab Results  Component Value Date   WBC 9.1 09/28/2013   HGB 12.8 09/28/2013   HCT 36.9 09/28/2013   MCV 86.2 09/28/2013   PLT 244 09/28/2013    US Transvaginal Non-ob  09/27/2013   CLINICAL DATA:  Acute right-sided pain, evaluate for torsion  EXAM: TRANSVAGINAL ULTRASOUND OF PELVIS  DOPPLER ULTRASOUND OF OVARIES  TECHNIQUE: Transvaginal ultrasound examination of the pelvis was performed including evaluation of the uterus, ovaries, adnexal regions, and pelvic cul-de-sac.  Color and duplex Doppler ultrasound was utilized to evaluate blood flow to the ovaries.  COMPARISON:  [facility] pelvic ultrasound dated 09/23/2013  FINDINGS: Uterus  Measurements: 7.8 x 3.6 x 4.5 cm. No fibroids or other mass visualized.  Endometrium  Poorly visualized.  IUD in satisfactory position.  Right ovary  Measurements: 7.9 x 4.9 x 3.8 cm. Dominant 4.6 x 4.5 x 4.5 cm predominantly simple cyst but with possible 16 x 13 mm peripheral mural nodule (image 20). Additional 2.8 x 2.9 x 2.6 cm complex/septated cyst.  Left ovary  Measurements: 3.4 x 2.1 x 4.0 cm. Normal appearance/no adnexal mass.  Pulsed Doppler evaluation demonstrates normal low-resistance arterial and venous waveforms in both ovaries.  Additional comments: Trace pelvic fluid.  IMPRESSION: No evidence of ovarian torsion.  4.6 cm cystic right ovarian lesion with possible mural nodule. Additional 2.9 cm complex/septated right ovarian cyst. Despite the appearance, these findings are likely physiologic given the patient's age, and follow-up pelvic ultrasound is suggested in 6-10 weeks. If persistent, consider pelvic MRI  with/without contrast for further evaluation.  IUD in satisfactory position.   Electronically Signed   By: Julian Hy M.D.   On: 09/27/2013 17:31   Labs: Results for Julie, Doyle (MRN [id number]) as of 09/28/2013 07:44  Ref. Range 09/28/2013 05:10  WBC Latest Range: 4.0-10.5 K/uL 9.1  RBC Latest Range: 3.87-5.11 MIL/uL 4.28  Hemoglobin Latest Range: 12.0-15.0 g/dL 12.8  HCT Latest Range: 36.0-46.0 % 36.9  MCV Latest Range: 78.0-100.0 fL 86.2  MCH Latest Range: 26.0-34.0 pg 29.9  MCHC Latest Range: 30.0-36.0 g/dL 34.7  RDW Latest Range: 11.5-15.5 % 12.6  Platelets Latest Range: 150-400 K/uL 244   CBC last night: Results for Julie, Doyle (MRN [id number]) as of 09/28/2013 07:44  Ref. Range 09/28/2013 05:10  WBC Latest Range: 4.0-10.5 K/uL 9.1  RBC Latest Range: 3.87-5.11 MIL/uL 4.28  Hemoglobin Latest Range: 12.0-15.0 g/dL 12.8  HCT Latest Range: 36.0-46.0 % 36.9  MCV Latest Range: 78.0-100.0 fL 86.2  MCH Latest Range: 26.0-34.0 pg 29.9  MCHC Latest Range: 30.0-36.0 g/dL 34.7  RDW Latest Range: 11.5-15.5 % 12.6  Platelets Latest Range: 150-400 K/uL 244    {Physical NWGN:[id number]  Medications:I have reviewed the patient's current medications.  Assessment/Plan  Patient was kept NPO last PM. After reassessment this am it has been decided to proceed with right Laparoscopic cystectomy possible RSO possible laparotomy as  A result of worsening pain and enlarging ovarian cyst. Patient once again was counseled on the risk of surgery to include the following:  Patient was counseled as to the risk of surgery to include the following:  1. Infection (prohylactic antibiotics will be administered)  2. DVT/Pulmonary Embolism (prophylactic pneumo compression stockings will be used)  3.Trauma to internal organs requiring additional surgical procedure to repair any injury to     Internal organs requiring perhaps additional hospitalization days.  4.Hemmorhage  requiring transfusion and blood products which carry risks such as             anaphylactic reaction, hepatitis and AIDS  Patient had received literature information on the procedure scheduled and all her questions were answered and fully accepts all risk.   [facility] HMD8:04 AMTD@Note : This dictation was prepared with  Dragon/digital dictation along withSmart phrase technology. Any transcriptional errors that result from this process are unintentional.      1  Terrance Mass, MD 7:43 AM 09/28/2013

## 2013-09-28 NOTE — Anesthesia Postprocedure Evaluation (Signed)
  Anesthesia Post-op Note  Patient: Julie Doyle  Procedure(s) Performed: Procedure(s): LAPAROSCOPIC RIGHT OOPHORECTOMY (Right)  Patient Location: [facility]  Anesthesia Type:General  Level of Consciousness: awake  Airway and Oxygen Therapy: Patient Spontanous Breathing  Post-op Pain: mild  Post-op Assessment: Patient's Cardiovascular Status Stable and Respiratory Function Stable  Post-op Vital Signs: stable  Complications: No apparent anesthesia complications

## 2013-09-28 NOTE — Op Note (Signed)
Julie Doyle, Julie Doyle NO.:  [id number]  MEDICAL RECORD NO.:  [id number]  LOCATION:  9563                          FACILITY:  [facility]  PHYSICIAN:  Alver Sorrow. Charlies Constable, M.D. DATE OF BIRTH:  [birth year]  DATE OF PROCEDURE:  09/28/2013 DATE OF DISCHARGE:                              OPERATIVE REPORT   PREOPERATIVE DIAGNOSIS:  Complex right ovarian cysts.  POSTOPERATIVE DIAGNOSIS:  Complex right ovarian cysts plus pelvic adhesions.  SURGEON:  Alver Sorrow. Charlies Constable, M.D.  ASSISTANT:  Dr. Toney Rakes.  ANESTHESIA:  General.  IV FLUIDS:  1600 mL lactated Ringer's.  URINE OUTPUT:  100 mL of clear urine.  ESTIMATED BLOOD LOSS:  Less than 25 mL.  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  The patient had represented back to the office with recurrent pelvic pain, and was found to have an ovarian cyst.  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 There has never been fluid in the cul-de-sac.  CA-125 was normal.  The patient presented to the emergency room last night with acute onset of the pain.  She had a repeat ultrasound which ruled out torsion because the patient had recently eaten, decision was made to admit her overnight for pain management and to undergo right salpingo-oophorectomy as previously discussed in the office.  FINDINGS:  There were thin filmy adhesions of the right ovary to the posterior uterus sidewall and tubes.  There were also some thin filmy adhesions in the cul-de-sac.  The right ovary was markedly distended ovarian tissue was unable to be determined between the 2 large cysts. The fimbriated end of the right tube was adherent to the ovary and encompassed in the thin filmy adhesions.  The ureter on the right was able to be visualized.  The left adnexa was mobile, normal appearing with lush fimbria.  The  anterior cul-de-sac, liver, and stomach were visualized and unremarkable.  The appendix was surgically absent.  DESCRIPTION OF PROCEDURE:  The patient was taken to the operating room. General anesthesia was induced.  Placed in the dorsal lithotomy position.  Prepped and draped in usual sterile fashion.  Bivalve speculum was placed in the vagina.  The cervix was visualized, stabilized with a single-tooth tenaculum and a Hulka clip was then placed.  Attention was then turned to the abdomen.  The skin was injected with ropivacaine diluted 1:1 with normal saline and then infraumbilical incision was made with the scalpel.  The umbilicus was entered with direct visualization with a blunt probe.  A pneumoperitoneum was then created.  During this time, 60 mL of dilute ropivacaine was placed into the pelvis.  Once the abdomen was adequately distended, the pelvis was inspected.  Two 5 ports were made under direct visualization, laterally.  Using these two 5 ports, the right adnexa was inspected and noted to be moderate amount of thin filmy adhesions.  The ureter was able to be identified.  The adhesions were slowly taken down with careful attention to the underlying ureter which was noted to be inferior to this area.  Once there was adequate mobility, the ovary was deviated, the IP was identified, elevated and treated with cautery with the gyrus.  Again, the peristalsis of the ureter was visualized.  The IP was then dissected.  The ovary was dissected off the IP ligament.  The dissection carried through over the broad ligament with careful attention when needed.  Further adhesion lysis was performed.  The utero- ovarian ligament was then identified, similarly was treated with cautery and transected.  The specimen was then placed in the anterior cul-de-sac areas of dissection were inspected.  Small areas of bleeding were treated where appropriate with cautery.  The scope was then changed to a 5 mm  scope and an Endopouch was then placed into the infraumbilical port.  The bag was opened.  The ovary flipped out of the bag and the bag needed to be deflated in order to be removed in order to better reidentify and grasp the ovary.  The second bag was then placed through the infraumbilical port.  The ovary was then successfully placed in the bag and the bag was cinched down  under direct visualization.  The bag was then brought through the umbilical port.  The umbilical fascia was then extended sharply.  The ovary was brought up closer to the skin surface.  The ovary was pierced and clear fluid was drained.  This was put into the suction irrigator.  The ovary was then delivered in a piecemeal fashion.  The umbilical fascia was further extended.  The ovary was then re-deflated and the fluid again was carefully held within the bag and sent for permanent.  The ovary was then able to be delivered entirely in the bag without any peritoneal spillage.  The umbilical port was then placed back and the pneumoperitoneum was recreated.  Inspection of the operative site confirmed that there was good hemostasis.  The ureter again was identified.  The 5 mm ports were removed under direct visualization and the infraumbilical port similarly was removed.  The gas was then removed with manipulation of the trocar.  The fascia was then closed with a running locked fashion of 0 Vicryl.  The skin on the umbilical port was closed with 4-0 plain.  The skin on the 2 lower ports were closed with Dermabond.  The patient had received anti- inflammatories preoperatively as well as last night in the form of Toradol and Aleve.  Pressure dressings were then placed on the two 5 mm ports.  The patient tolerated the procedure well.  Sponge, lap, needle counts were correct x2.  She has given cefotetan intraoperatively and transferred to the recovery room in stable condition.     Alver Sorrow. Charlies Constable, M.D.     THL/MEDQ   D:  09/28/2013  T:  09/28/2013  Job:  [id number]

## 2013-09-28 NOTE — Addendum Note (Signed)
Addendum created 09/28/13 1533 by Ignacia Bayley, CRNA   Modules edited: Notes Section   Notes Section:  File: [id number]

## 2013-09-28 NOTE — Brief Op Note (Signed)
09/27/2013 - 09/28/2013  10:55 AM  PATIENT:  Julie Doyle  28 y.o. female  PRE-OPERATIVE DIAGNOSIS:  Right Ovarian Cyst  POST-OPERATIVE DIAGNOSIS:  Right Ovarian Cyst  PROCEDURE:  Procedure(s): LAPAROSCOPIC RIGHT OOPHORECTOMY (Right) Right salpingectomy  SURGEON:  Surgeon(s) and Role:    * Terrance Mass, MD - [place], MD - Primary  PHYSICIAN ASSISTANT:   ASSISTANTS: none   ANESTHESIA:   general  EBL:  Total I/O In: 1600 [I.V.:1600] Out: 125 [Urine:100; Blood:25]  BLOOD ADMINISTERED:none  DRAINS: none   LOCAL MEDICATIONS USED:  Amount: 60 ml and OTHER dilute ropivicaine 1:1  SPECIMEN:  Source of Specimen:  right tube, ovary and cyst fluids  DISPOSITION OF SPECIMEN:  PATHOLOGY  COUNTS:  YES  TOURNIQUET:  * No tourniquets in log *  DICTATION: .Other Dictation: Dictation Number [id number]  PLAN OF CARE: Discharge to home after PACU  PATIENT DISPOSITION:  PACU - hemodynamically stable.   Delay start of Pharmacological VTE agent (>24hrs) due to surgical blood loss or risk of bleeding: not applicable

## 2013-09-28 NOTE — Transfer of Care (Signed)
Immediate Anesthesia Transfer of Care Note  Patient: Julie Doyle  Procedure(s) Performed: Procedure(s): LAPAROSCOPIC RIGHT OOPHORECTOMY (Right)  Patient Location: PACU  Anesthesia Type:General  Level of Consciousness: awake and alert   Airway & Oxygen Therapy: Patient Spontanous Breathing and Patient connected to nasal cannula oxygen  Post-op Assessment: Report given to PACU RN and Post -op Vital signs reviewed and stable  Post vital signs: Reviewed and stable  Complications: No apparent anesthesia complications

## 2013-09-28 NOTE — Progress Notes (Signed)
Pt had been seen earlier last week for a pre-op for RSO, she appeared to [facility] with acute exacerbation of pain and was admitted for pain management by Dr. Hebert Soho with the place to proceed with our intended surgery now.  Pt was seen pre-operatively by me and spoke to me on the phone last night and agreed to the plan ahead. Questions were readdressed. (late entry-epic system down to to power)

## 2013-09-29 ENCOUNTER — Encounter (HOSPITAL_COMMUNITY): Payer: Self-pay | Admitting: Gynecology

## 2013-09-29 ENCOUNTER — Telehealth: Payer: Self-pay | Admitting: Gynecology

## 2013-09-29 DIAGNOSIS — D279 Benign neoplasm of unspecified ovary: Secondary | ICD-10-CM

## 2013-09-29 DIAGNOSIS — N736 Female pelvic peritoneal adhesions (postinfective): Secondary | ICD-10-CM

## 2013-09-29 NOTE — Telephone Encounter (Signed)
Pt wants to know if her results are in yet/

## 2013-09-29 NOTE — Telephone Encounter (Signed)
Patient calling requesting advice on how to get her surgical tape off. She is in tears and says, "It is hurting and there is discharge coming out."

## 2013-09-29 NOTE — Telephone Encounter (Signed)
Pt calling to let Olivia Mackie know she was able to get the bandage off

## 2013-09-29 NOTE — Telephone Encounter (Signed)
Patient was seen in MAU over the weekend and had surgery on 09-28-13.  See next encounters.

## 2013-09-29 NOTE — Telephone Encounter (Signed)
Spoke with patient. She is having difficulty with removing her dressings. She is greater than 24 hours post op. No fevers, no pain in abdomen, pain at site of tape removal.  She states that there is a small amount of clear drainage from the site. Spoke with Dr. Charlies Constable. She can try to take a shower and lightly moisten area, that should help with removal. Advised that it is tape and may cause pain to remove because it sticks to skin. Advised to return call if any worsening of symptoms, we discussed signs of infection such as the area becoming sore, swollen, red, foul smelling draining, increasing in drainage. Patient verbalized understanding of instructions. She will call back for office visit if unable to remove dressings.  Routing to provider for final review. Patient agreeable to disposition. Will close encounter

## 2013-09-29 NOTE — Telephone Encounter (Signed)
Advised to early for results.  Patient aware. Advised would call with results. Usually takes about 3 days. Patient verbalized understanding.

## 2013-09-29 NOTE — Telephone Encounter (Signed)
No. Too soon

## 2013-09-29 NOTE — Telephone Encounter (Signed)
Patient was seen in ER over weeknd and had surgery on 09-28-13.  Routing to provider for final review. Patient agreeable to disposition. Will close encounter

## 2013-10-01 ENCOUNTER — Telehealth: Payer: Self-pay | Admitting: Emergency Medicine

## 2013-10-01 NOTE — Telephone Encounter (Signed)
Message copied by Michele Mcalpine on Wed Oct 01, 2013  4:24 PM ------      Message from: Elveria Rising      Created: Wed Oct 01, 2013  3:53 PM       pls inform path went to [facility]                  Ovary, right      - RIGHT OVARY: MATURE CYSTIC TERATOMA. NO IMMATURE OR MALIGNANT FEATURES.      - RIGHT FALLOPIAN TUBE: UNREMARKABLE.      Cyst fluid benign ------

## 2013-10-01 NOTE — Telephone Encounter (Signed)
Spoke with patient. Advised of message from Dr. Charlies Constable. She is verbalized understanding of normal results. States she is feeling better, but fatigued. Advised to continue resting and increase activity as tolerated.   Patient has post op appointment scheduled for 10/06/13 at 1130. She will call with any further issues.   Routing to provider for final review. Patient agreeable to disposition. Will close encounter

## 2013-10-06 ENCOUNTER — Ambulatory Visit: Payer: BC Managed Care – PPO | Admitting: Gynecology

## 2013-10-06 ENCOUNTER — Encounter: Payer: Self-pay | Admitting: Gynecology

## 2013-10-06 ENCOUNTER — Ambulatory Visit (INDEPENDENT_AMBULATORY_CARE_PROVIDER_SITE_OTHER): Payer: BC Managed Care – PPO | Admitting: Gynecology

## 2013-10-06 VITALS — BP 125/81 | HR 110 | Temp 99.1°F | Resp 18 | Ht 65.0 in | Wt 189.0 lb

## 2013-10-06 DIAGNOSIS — Z9889 Other specified postprocedural states: Secondary | ICD-10-CM

## 2013-10-06 DIAGNOSIS — R350 Frequency of micturition: Secondary | ICD-10-CM

## 2013-10-06 LAB — POCT URINALYSIS DIPSTICK
PH UA: 5
Urobilinogen, UA: NEGATIVE

## 2013-10-06 NOTE — Patient Instructions (Signed)
Can apply otc monistat to clitorus F/u with pcp re elevated temps

## 2013-10-06 NOTE — Progress Notes (Signed)
Subjective:     Julie Doyle is a 28 y.o. female who presents to the clinic 1 weeks status post right oophorectomy for adnexal mass. Eating a regular diet with difficulty. Bowel movements are normal. The patient is not having any pain. pt reports some labial tenderness and nonspecific pain.  The following portions of the patient's history were reviewed and updated as appropriate: allergies, current medications, past family history, past medical history, past social history, past surgical history and problem list.  Review of Systems Pertinent items are noted in HPI.    Objective:    BP 125/81  Pulse 110  Temp(Src) 99.1 F (37.3 C)  Resp 18  Ht 5\' 5"  (1.651 m)  Wt 189 lb (85.73 kg)  BMI 31.45 kg/m2  LMP 09/19/2013 General:  alert, cooperative and appears stated age  Abdomen: soft, non-tender, mild bruising  Incision:   healing well, no drainage, no erythema, no hernia, no seroma, no swelling, no dehiscence, incision well approximated      Pelvic: External genitalia:  no lesions, no lesions, edema              Urethra:  normal appearing urethra with no masses, tenderness or lesions              Bartholins and Skenes: normal                 Vagina: normal appearing vagina with normal color and discharge, no lesions              Cervix: normal appearance, IUD strings seen                      Bimanual Exam:  Uterus:  uterus is normal size, shape, consistency and nontender                                      Adnexa: normal adnexa in size, nontender and no masses                                        Assessment:    Doing well postoperatively. Operative findings again reviewed. Pathology report discussed.  Will check urine    Plan:    1. Continue any current medications. 2. Wound care discussed. 3. Activity restrictions: no sports and sex 4. Anticipated return to work: 1-2 weeks. 5. Follow up: 1 week

## 2013-10-07 ENCOUNTER — Ambulatory Visit (HOSPITAL_BASED_OUTPATIENT_CLINIC_OR_DEPARTMENT_OTHER)
Admission: RE | Admit: 2013-10-07 | Discharge: 2013-10-07 | Disposition: A | Discharge status: Home / Self Care | Payer: BC Managed Care – PPO | Source: Ambulatory Visit | Attending: Internal Medicine | Admitting: Internal Medicine

## 2013-10-07 ENCOUNTER — Encounter: Payer: Self-pay | Admitting: Internal Medicine

## 2013-10-07 ENCOUNTER — Ambulatory Visit: Payer: BC Managed Care – PPO | Admitting: Internal Medicine

## 2013-10-07 ENCOUNTER — Encounter (HOSPITAL_BASED_OUTPATIENT_CLINIC_OR_DEPARTMENT_OTHER): Payer: Self-pay | Admitting: Emergency Medicine

## 2013-10-07 ENCOUNTER — Inpatient Hospital Stay (HOSPITAL_BASED_OUTPATIENT_CLINIC_OR_DEPARTMENT_OTHER)
Admission: EM | Admit: 2013-10-07 | Discharge: 2013-10-09 | DRG: 300 | Disposition: A | Discharge status: Home / Self Care | Payer: BC Managed Care – PPO | Attending: Internal Medicine | Admitting: Internal Medicine

## 2013-10-07 ENCOUNTER — Ambulatory Visit (INDEPENDENT_AMBULATORY_CARE_PROVIDER_SITE_OTHER): Payer: BC Managed Care – PPO | Admitting: Internal Medicine

## 2013-10-07 ENCOUNTER — Other Ambulatory Visit: Payer: Self-pay

## 2013-10-07 VITALS — BP 112/72 | HR 99 | Temp 99.2°F | Resp 16 | Wt 191.0 lb

## 2013-10-07 DIAGNOSIS — D6851 Activated protein C resistance: Secondary | ICD-10-CM | POA: Diagnosis present

## 2013-10-07 DIAGNOSIS — I82409 Acute embolism and thrombosis of unspecified deep veins of unspecified lower extremity: Secondary | ICD-10-CM | POA: Diagnosis present

## 2013-10-07 DIAGNOSIS — D72829 Elevated white blood cell count, unspecified: Secondary | ICD-10-CM | POA: Diagnosis present

## 2013-10-07 DIAGNOSIS — M79605 Pain in left leg: Secondary | ICD-10-CM

## 2013-10-07 DIAGNOSIS — D270 Benign neoplasm of right ovary: Secondary | ICD-10-CM

## 2013-10-07 DIAGNOSIS — R102 Pelvic and perineal pain: Secondary | ICD-10-CM

## 2013-10-07 DIAGNOSIS — I824Z9 Acute embolism and thrombosis of unspecified deep veins of unspecified distal lower extremity: Secondary | ICD-10-CM

## 2013-10-07 DIAGNOSIS — F411 Generalized anxiety disorder: Secondary | ICD-10-CM | POA: Diagnosis present

## 2013-10-07 DIAGNOSIS — M79609 Pain in unspecified limb: Secondary | ICD-10-CM

## 2013-10-07 DIAGNOSIS — D6859 Other primary thrombophilia: Secondary | ICD-10-CM | POA: Diagnosis present

## 2013-10-07 LAB — BASIC METABOLIC PANEL
BUN: 12 mg/dL (ref 6–23)
CALCIUM: 9.6 mg/dL (ref 8.4–10.5)
CO2: 25 meq/L (ref 19–32)
Chloride: 103 mEq/L (ref 96–112)
Creatinine, Ser: 0.7 mg/dL (ref 0.50–1.10)
GFR calc Af Amer: >90 mL/min (ref >90)
GFR calc non Af Amer: >90 mL/min (ref >90)
GLUCOSE: 98 mg/dL (ref 70–99)
Potassium: 4.2 mEq/L (ref 3.7–5.3)
Sodium: 142 mEq/L (ref 137–147)

## 2013-10-07 LAB — CBC
HCT: 41.4 % (ref 36.0–46.0)
Hemoglobin: 14.3 g/dL (ref 12.0–15.0)
MCH: 30.4 pg (ref 26.0–34.0)
MCHC: 34.5 g/dL (ref 30.0–36.0)
MCV: 87.9 fL (ref 78.0–100.0)
Platelets: 336 10*3/uL (ref 150–400)
RBC: 4.71 MIL/uL (ref 3.87–5.11)
RDW: 12.7 % (ref 11.5–15.5)
WBC: 11.2 10*3/uL — ABNORMAL HIGH (ref 4.0–10.5)

## 2013-10-07 LAB — URINE CULTURE
COLONY COUNT: NO GROWTH
Organism ID, Bacteria: NO GROWTH

## 2013-10-07 LAB — PROTIME-INR
INR: 0.91 (ref 0.00–1.49)
Prothrombin Time: 12.1 seconds (ref 11.6–15.2)

## 2013-10-07 LAB — D-DIMER, QUANTITATIVE: D-Dimer, Quant: 0.88 ug/mL-FEU — ABNORMAL HIGH (ref 0.00–0.48)

## 2013-10-07 LAB — SEDIMENTATION RATE: Sed Rate: 14 mm/hr (ref 0–22)

## 2013-10-07 LAB — TROPONIN I: Troponin I: <0.3 ng/mL (ref <0.30)

## 2013-10-07 LAB — RHEUMATOID FACTOR: Rhuematoid fact SerPl-aCnc: <10 IU/mL (ref <=14)

## 2013-10-07 MED ORDER — SODIUM CHLORIDE 0.9 % IJ SOLN
3.0000 mL | Freq: Two times a day (BID) | INTRAMUSCULAR | Status: DC
  Administered 2013-10-07 – 2013-10-09 (×4): 3 mL via INTRAVENOUS

## 2013-10-07 MED ORDER — ENOXAPARIN SODIUM 100 MG/ML ~~LOC~~ SOLN
1.0000 mg/kg | Freq: Once | SUBCUTANEOUS | Status: AC
  Administered 2013-10-07: 85 mg via SUBCUTANEOUS
  Filled 2013-10-07: qty 1

## 2013-10-07 MED ORDER — HYDROCODONE-ACETAMINOPHEN 5-325 MG PO TABS
2.0000 | ORAL_TABLET | Freq: Once | ORAL | Status: AC
  Administered 2013-10-07: 2 via ORAL
  Filled 2013-10-07: qty 2

## 2013-10-07 MED ORDER — WARFARIN - PHARMACIST DOSING INPATIENT
Freq: Every day | Status: DC
  Administered 2013-10-08 – 2013-10-09 (×2)

## 2013-10-07 MED ORDER — SENNA 8.6 MG PO TABS
2.0000 | ORAL_TABLET | Freq: Every day | ORAL | Status: DC
  Administered 2013-10-08 – 2013-10-09 (×3): 17.2 mg via ORAL
  Filled 2013-10-07 (×3): qty 2

## 2013-10-07 MED ORDER — MORPHINE SULFATE 2 MG/ML IJ SOLN
1.0000 mg | INTRAMUSCULAR | Status: DC | PRN
  Administered 2013-10-07 (×2): 1 mg via INTRAVENOUS
  Filled 2013-10-07 (×2): qty 1

## 2013-10-07 MED ORDER — ENOXAPARIN SODIUM 100 MG/ML ~~LOC~~ SOLN
90.0000 mg | Freq: Once | SUBCUTANEOUS | Status: AC
  Administered 2013-10-08: 90 mg via SUBCUTANEOUS
  Filled 2013-10-07: qty 1

## 2013-10-07 MED ORDER — NAPHAZOLINE-PHENIRAMINE 0.025-0.3 % OP SOLN: 1.0000 [drp] | Freq: Four times a day (QID) | OPHTHALMIC | Status: DC | PRN

## 2013-10-07 MED ORDER — LORAZEPAM 2 MG/ML IJ SOLN: 0.5000 mg | Freq: Three times a day (TID) | INTRAMUSCULAR | Status: DC | PRN

## 2013-10-07 MED ORDER — LORAZEPAM 0.5 MG PO TABS
0.5000 mg | ORAL_TABLET | Freq: Three times a day (TID) | ORAL | Status: DC | PRN
  Administered 2013-10-08: 0.5 mg via ORAL
  Filled 2013-10-07: qty 1

## 2013-10-07 MED ORDER — LORAZEPAM 2 MG/ML IJ SOLN
0.5000 mg | Freq: Once | INTRAMUSCULAR | Status: AC
  Administered 2013-10-07: 0.5 mg via INTRAVENOUS
  Filled 2013-10-07: qty 1

## 2013-10-07 MED ORDER — METHYLPHENIDATE HCL ER (OSM) 18 MG PO TBCR
54.0000 mg | EXTENDED_RELEASE_TABLET | Freq: Every day | ORAL | Status: DC
  Filled 2013-10-07: qty 3

## 2013-10-07 MED ORDER — WARFARIN SODIUM 7.5 MG PO TABS
7.5000 mg | ORAL_TABLET | Freq: Once | ORAL | Status: AC
  Administered 2013-10-08: 7.5 mg via ORAL
  Filled 2013-10-07: qty 1

## 2013-10-07 MED ORDER — ENOXAPARIN SODIUM 100 MG/ML ~~LOC~~ SOLN
90.0000 mg | Freq: Two times a day (BID) | SUBCUTANEOUS | Status: DC
  Administered 2013-10-08 – 2013-10-09 (×3): 90 mg via SUBCUTANEOUS
  Filled 2013-10-07 (×4): qty 1

## 2013-10-07 NOTE — Progress Notes (Signed)
Patient being admitted for DVT.  Er doc tried to send home with xarelto- PCP preferred admitting with lovenox to coumadin bridge.  Not sure why patient is not a candidate for newer agent.  May need to side bar with Heme onc once patient here -suspect provoked from recent surgery  Julie Doyle

## 2013-10-07 NOTE — Significant Event (Signed)
Rapid Response Event Note  Overview: Time Called: 2030 Arrival Time: 2035 Event Type: Cardiac  Initial Focused Assessment: Called by bedside RN regarding SOB/CP after receiving patient as a direct admit from [place]. Upon arrival to room, patient alert, oriented, and extremely anxious. Patient reports stabbing chest pain on left side above breast, worsened with inspiration. Patient rates pain 6/10. Patient states pain started on her way from [place]. Vital signs stable, no desaturation noted. RR 20. Lung sounds clear. Patient positive for DVT and admitted to [facility] for bridge lovenox to coumadin therapy.  Interventions: O2 at 2L [place] placed by bedside RN, Admitting MD and covering NP notified. Orders received for d-dimer, troponin, EKG, and ativan. EKG obtained, NSR and 0.5mg  IV ativan given by bedside RN. Patient and mother updated on plan of care.  Admitting MD to see patient. Will continue to monitor, advised bedside RN to call with further needs.  Event Summary: Name of Physician Notified: Forrest Moron, NP at 2030    at    Outcome: Stayed in room and stabalized  Event End Time: 2100  Julie Doyle

## 2013-10-07 NOTE — ED Notes (Signed)
Ok to transfer by POV with saline lock intact to [facility] , per Dr. Julious Oka.

## 2013-10-07 NOTE — ED Notes (Addendum)
Pt c/o left calf pain and "some shortness of breath".  Pt sts she had [place] today after visit with PCP and diagnosed with DVT.

## 2013-10-07 NOTE — Progress Notes (Signed)
Subjective:    Patient ID: Julie Doyle, female    DOB: 06/07/1986, 28 y.o.   MRN: [id number]  HPI  Julie Doyle is here for follow up.  She underwent a R salpingo-oopherectomy on 3/1.  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    Prior to surgery she was concerned about fevers.  She documented temps on her cell phone.  All temps have been 99.7 or below except for one that was 100.5 on 3/9.  As per last visit. she is completely asymptomatic.  No rash no synovitis no cough, no dysruia.  Only pain is normal post op mild discomfort at laparotomy site.   She does have calf pain on left  No swelling.  She does have a genetic Factor V Leiden  Allergies  Allergen Reactions  . Ivp Dye [Iodinated Diagnostic Agents] Hives and Shortness Of Breath   Past Medical History  Diagnosis Date  . Factor 5 Leiden mutation, heterozygous   . Anxiety   . Clotting disorder   . Dysmenorrhea   . HSV-1 infection   . Blood dyscrasia     factor V  . Ovarian cyst    Past Surgical History  Procedure Laterality Date  . Orif of right fib  2011    Open Reduction of the right fibula  . Appendectomy  1998  . Wisdom tooth extraction  2012  . Cystectomy Right 01/07/2013    Laparoscopic  . Laparoscopy Right 01/07/2013    Procedure: LAPAROSCOPY OPERATIVE;  Surgeon: Azalia Bilis, MD;  Location: [facility];  Service: Gynecology;  Laterality: Right;  ovarian cystectomy  . Intrauterine device (iud) insertion N/A 01/07/2013    Procedure: INTRAUTERINE DEVICE (IUD) INSERTION;  Surgeon: Azalia Bilis, MD;  Location: [facility];  Service: Gynecology;  Laterality: N/A;  . Laparoscopic ovarian cystectomy Right 09/28/2013    Procedure: LAPAROSCOPIC RIGHT OOPHORECTOMY;  Surgeon: Azalia Bilis, MD;  Location: [facility];  Service: Gynecology;  Laterality: Right;   History   Social History  . Marital Status: Single    Spouse Name: N/A    Number of Children: N/A  . Years of Education: N/A   Occupational History  . Not on file.    Social History Main Topics  . Smoking status: Never Smoker   . Smokeless tobacco: Never Used  . Alcohol Use: Yes     Comment: socially   . Drug Use: No  . Sexual Activity: Yes    Birth Control/ Protection: Condom   Other Topics Concern  . Not on file   Social History Narrative  . No narrative on file   Family History  Problem Relation Age of Onset  . Hypertension Mother   . Cancer Maternal Grandmother     pancreatic  . Diabetes Maternal Grandmother   . Pancreatic cancer Maternal Grandmother   . Cancer Paternal Grandmother   . Breast cancer Paternal Grandmother   . Lung cancer Paternal Grandfather    Patient Active Problem List   Diagnosis Date Noted  . Pelvic mass in female 09/27/2013  . 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 Anxiety state, unspecified 11/12/2012  . Reactive depression (situational) 11/12/2012  . Dysmenorrhea 11/12/2012  . Insomnia 11/12/2012  . TMJ (dislocation of temporomandibular joint) 11/12/2012  . Factor 5 Leiden mutation, heterozygous    Current Outpatient Prescriptions on File Prior to Visit  Medication Sig Dispense Refill  . BIOTIN PO Take 1 tablet by mouth daily.      Marland Kitchen HYDROcodone-acetaminophen (LORTAB) 7.5-325 MG per tablet  Take 1 tablet by mouth every 6 (six) hours as needed for moderate pain.  30 tablet  0  . Naphazoline-Pheniramine (EYE ALLERGY RELIEF OP) Apply 1 drop to eye daily.      . methylphenidate (CONCERTA) 54 MG CR tablet Take 54 mg by mouth every morning.       No current facility-administered medications on file prior to visit.      Review of Systems   See HPI   Objective:   Physical Exam Physical Exam  Nursing note and vitals reviewed.  Constitutional: She is oriented to person, place, and time. She appears well-developed and well-nourished.  HENT:  Head: Normocephalic and atraumatic.  Cardiovascular: Normal rate and regular rhythm. Exam reveals no gallop and no friction rub.  No murmur heard.  Pulmonary/Chest:  Breath sounds normal. She has no wheezes. She has no rales.  Neurological: She is alert and oriented to person, place, and time.  Ext:   Left lower extremeity Homans sign positive No venous chords No significant edema when compared to right LE Psychiatric: She has a normal mood and affect. Her behavior is normal.       Assessment & Plan:  Minimal temp elevation  Will check ANA, RF and sed rate.  Nothing.  Most temps are normal in the 99 range or below  L calf pain with recent pelvic surgery and Factor V leiden  Will get venous doppler    I spent over an hour with this patient  Addendum  :   Venous doppler show DVT Left peroneal  - will transfer to ER for initial anticoagulation  Spoke with RN Jocelyn Lamer

## 2013-10-07 NOTE — Progress Notes (Signed)
ANTICOAGULATION CONSULT NOTE - Initial Consult  Pharmacy Consult for lovenox and coumadin Indication: DVT  Allergies  Allergen Reactions  . Ivp Dye [Iodinated Diagnostic Agents] Hives and Shortness Of Breath    Patient Measurements: Height: 5\' 6"  (167.6 cm) Weight: 190 lb (86.183 kg) IBW/kg (Calculated) : 59.3 Heparin Dosing Weight:   Vital Signs: Temp: 99.7 F (37.6 C) (03/10 2035) Temp src: Oral (03/10 2035) BP: 131/72 mmHg (03/10 2035) Pulse Rate: 83 (03/10 2035)  Labs:  Recent Labs  10/07/13 1615  HGB 14.3  HCT 41.4  PLT 336  CREATININE 0.70    Estimated Creatinine Clearance: 119 ml/min (by C-G formula based on Cr of 0.7).   Medical History: Past Medical History  Diagnosis Date  . Factor 5 Leiden mutation, heterozygous   . Anxiety   . Clotting disorder   . Dysmenorrhea   . HSV-1 infection   . Blood dyscrasia     factor V  . Ovarian cyst     Medications:  Scheduled:  . [START ON 10/08/2013] enoxaparin (LOVENOX) injection  90 mg Subcutaneous Once  . [START ON 10/08/2013] enoxaparin (LOVENOX) injection  90 mg Subcutaneous Q12H  . [START ON 10/08/2013] methylphenidate  54 mg Oral Q breakfast  . senna  2 tablet Oral Daily  . sodium chloride  3 mL Intravenous Q12H  . [START ON 10/08/2013] warfarin  7.5 mg Oral Once  . [START ON 10/08/2013] Warfarin - Pharmacist Dosing Inpatient   Does not apply q1800    Assessment: 28 yr old female admitted for treatment of LLE DVT. Pt has factor V leiden mutation. She had a rt oophorectomy on 3/1. She has had LLE swelling and pain for the past 3-4 days. She is admitted for lovenox bridge to therapeutic coumadin. Goal of Therapy:  INR 2-3 Monitor platelets by anticoagulation protocol: Yes   Plan:  Lovenox 90 mg sq q12h. CBC q3 days while on lovenox to monitor platelets. Coumadin 7.5 mg tonight. INR now and q am.  Minta Balsam 10/07/2013,11:09 PM

## 2013-10-07 NOTE — Patient Instructions (Signed)
To Er

## 2013-10-07 NOTE — Progress Notes (Addendum)
Pt here from [facility] admitted directly with DVT. Had recent surgery. [facility] doc hasn't admitted pt yet. Flow mngr aware of pt's arrival. RN paged this NP secondary to pt c/o stabbling chest pain. VSS. No desaturation. RR 20. No increased WOB. Rapid response RN at bedside and states pt is anxious. Because of DVT, will get DDimer, stat EKG, troponins and reassess. Ativan for anxiety. If + Ddimer, get CTA chest.  Clance Boll, NP [facility] Update: 12 lead EKG without acute changes. Labs are pending. I discussed case with Dr. Ernestina Patches of [facility] who will be the admitting MD. Pt will likely need CTA, but will await labs and Dr. Romona Curls assessment. Ativan helped. Pt stable at present.  KJKG, NP

## 2013-10-07 NOTE — H&P (Addendum)
Hospitalist Admission History and Physical  Patient name: Julie Doyle Medical record number: [id number] Date of birth: [birth year] Age: 28 y.o. Gender: female  Primary Care Provider: Kelton Pillar, MD  Chief Complaint: DVT  History of Present Illness:This is a 29 y.o. year old female with prior hx/o factor V leiden disease, symptomatic ovarian cysts s/p laproscopic oopherectomy 09/28/13 presenting with DVT. Pt states that she had prior hx/o factor V leiden disease. Had lap oopherectomy last week. Was hospitalized overnight. States that she was not placed on anticoagulation. Has had LLE swelling and pain over past 3-4 days. Was sent for LE [place] by GYN. Had L peroneal vein DVT on u/s. Was sent to ER for anticoagulation w/ lovenox. Gyn requested that pt be admitted for lovenox bridging to coumadin per report. On arrival to [facility], pt developed some central CP and SOB. Rapid response called. Mildly tachycardic with no hypoxia. EKG NSR. Pt reported that she was anxious. Given 1mg  ativan with improvement in CP and SOB. Still with some CP and SOB. No nausea or diaphoresis. CEs pending.   Patient Active Problem List   Diagnosis Date Noted  . DVT (deep venous thrombosis) 10/07/2013  . Pelvic mass in female 09/27/2013  . 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 Anxiety state, unspecified 11/12/2012  . Reactive depression (situational) 11/12/2012  . Dysmenorrhea 11/12/2012  . Insomnia 11/12/2012  . TMJ (dislocation of temporomandibular joint) 11/12/2012  . Factor 5 Leiden mutation, heterozygous    Past Medical History: Past Medical History  Diagnosis Date  . Factor 5 Leiden mutation, heterozygous   . Anxiety   . Clotting disorder   . Dysmenorrhea   . HSV-1 infection   . Blood dyscrasia     factor V  . Ovarian cyst     Past Surgical History: Past Surgical History  Procedure Laterality Date  . Orif of right fib  2011    Open Reduction of the right fibula  . Appendectomy  1998  . Wisdom tooth  extraction  2012  . Cystectomy Right 01/07/2013    Laparoscopic  . Laparoscopy Right 01/07/2013    Procedure: LAPAROSCOPY OPERATIVE;  Surgeon: Azalia Bilis, MD;  Location: [facility];  Service: Gynecology;  Laterality: Right;  ovarian cystectomy  . Intrauterine device (iud) insertion N/A 01/07/2013    Procedure: INTRAUTERINE DEVICE (IUD) INSERTION;  Surgeon: Azalia Bilis, MD;  Location: [facility];  Service: Gynecology;  Laterality: N/A;  . Laparoscopic ovarian cystectomy Right 09/28/2013    Procedure: LAPAROSCOPIC RIGHT OOPHORECTOMY;  Surgeon: Azalia Bilis, MD;  Location: [facility];  Service: Gynecology;  Laterality: Right;    Social History: History   Social History  . Marital Status: Single    Spouse Name: N/A    Number of Children: N/A  . Years of Education: N/A   Social History Main Topics  . Smoking status: Never Smoker   . Smokeless tobacco: Never Used  . Alcohol Use: Yes     Comment: socially   . Drug Use: No  . Sexual Activity: Yes    Birth Control/ Protection: Condom   Other Topics Concern  . None   Social History Narrative  . None    Family History: Family History  Problem Relation Age of Onset  . Hypertension Mother   . Cancer Maternal Grandmother     pancreatic  . Diabetes Maternal Grandmother   . Pancreatic cancer Maternal Grandmother   . Cancer Paternal Grandmother   . Breast cancer Paternal Grandmother   . Lung  cancer Paternal Grandfather     Allergies: Allergies  Allergen Reactions  . Ivp Dye [Iodinated Diagnostic Agents] Hives and Shortness Of Breath    Current Facility-Administered Medications  Medication Dose Route Frequency Provider Last Rate Last Dose  . [START ON 10/08/2013] methylphenidate (CONCERTA) CR tablet 54 mg  54 mg Oral Aaron Mose, MD      . morphine 2 MG/ML injection 1-2 mg  1-2 mg Intravenous Q3H PRN Shanda Howells, MD      . naphazoline-pheniramine (NAPHCON-A) 0.025-0.3 % ophthalmic solution 1 drop  1 drop Both Eyes QID  PRN Shanda Howells, MD      . sodium chloride 0.9 % injection 3 mL  3 mL Intravenous Q12H Shanda Howells, MD       Review Of Systems: 12 point ROS negative except as noted above in HPI.  Physical Exam: Filed Vitals:   10/07/13 2035  BP: 131/72  Pulse: 83  Temp: 99.7 F (37.6 C)  Resp: 20    General: alert and cooperative HEENT: PERRLA and extra ocular movement intact Heart: S1, S2 normal, no murmur, rub or gallop, regular rate and rhythm Lungs: clear to auscultation, no wheezes or rales and unlabored breathing Abdomen: abdomen is soft without significant tenderness, masses, organomegaly or guarding, abd incision sites CDI Extremities: + l popliteal TTP  Skin:no rashes, no ecchymoses Neurology: normal without focal findings, mental status, speech normal, alert and oriented x3, PERLA and reflexes normal and symmetric  Labs and Imaging: Lab Results  Component Value Date/Time   NA 142 10/07/2013  4:15 PM   K 4.2 10/07/2013  4:15 PM   CL 103 10/07/2013  4:15 PM   CO2 25 10/07/2013  4:15 PM   BUN 12 10/07/2013  4:15 PM   CREATININE 0.70 10/07/2013  4:15 PM   CREATININE 0.77 09/23/2013  2:51 PM   GLUCOSE 98 10/07/2013  4:15 PM   Lab Results  Component Value Date   WBC 11.2* 10/07/2013   HGB 14.3 10/07/2013   HCT 41.4 10/07/2013   MCV 87.9 10/07/2013   PLT 336 10/07/2013    US Venous Img Lower Unilateral Left  10/07/2013   CLINICAL DATA Factor 5 Leiden deficiency.  Left calf pain.  EXAM Left LOWER EXTREMITY VENOUS DOPPLER ULTRASOUND  TECHNIQUE Gray-scale sonography with graded compression, as well as color Doppler and duplex ultrasound, were performed to evaluate the deep venous system from the level of the common femoral vein through the popliteal and proximal calf veins. Spectral Doppler was utilized to evaluate flow at rest and with distal augmentation maneuvers.  COMPARISON None.  FINDINGS Thrombus within deep veins: There is thrombus identified in the left peroneal vein. There is no other  thrombus visualized.  Compressibility of deep veins: The left peroneal vein is noncompressible. The remainder of the deep venous system is compressible.  Duplex waveform respiratory phasicity:  Normal.  Duplex waveform response to augmentation:  Normal.  Venous reflux:  None visualized.  Other findings: There is no color flow identified within the left peroneal vein.  IMPRESSION 1. Occlusive thrombosis of the left peroneal vein.  SIGNATURE  Electronically Signed   By: Kathreen Devoid   On: 10/07/2013 13:53     Assessment and Plan: Lanae Crumbly Kimm is a 28 y.o. year old female presenting with DVT, subjective SOB, CP  DVT/CP: Start on lovenox with coumadin bridge. Agree that pt would likely have been adequate direct thrombin inhibitor vs. Outpt lovenox candidate. Will consult H-O in am, unclear of  any current contraindications. VQ scan to r/o PE (IV contrast allergy). No tachycardia or hypoxia currently. No S1Q3T3 on EKG. Suspect anxiety component. CEs pending. ddimer drawn. Will be elevated in setting of active DVT. Supplemental O2 prn.   Leukocytosis: May be reactive in setting of postoperative state. Check CXR and UA to rule out infection. Trend CBC.   FEN/GI: reg diet.  Prophylaxis: lovenox and coumadin Disposition: pending further evaluation  Code Status:Full Code.        Shanda Howells MD  Pager: [phone]

## 2013-10-07 NOTE — ED Provider Notes (Signed)
CSN: [id number]     Arrival date & time 10/07/13  1426 History   First MD Initiated Contact with Patient 10/07/13 1456     Chief Complaint  Patient presents with  . DVT     (Consider location/radiation/quality/duration/timing/severity/associated sxs/prior Treatment) Patient is a 28 y.o. female presenting with leg pain. The history is provided by the patient.  Leg Pain Location:  Leg Injury: no   Leg location:  L lower leg Pain details:    Quality:  Aching   Radiates to:  Does not radiate   Severity:  Moderate   Onset quality:  Gradual   Duration:  10 days   Timing:  Constant   Progression:  Worsening Prior injury to area:  No Relieved by:  Nothing Worsened by:  Nothing tried Associated symptoms: no fever     Past Medical History  Diagnosis Date  . Factor 5 Leiden mutation, heterozygous   . Anxiety   . Clotting disorder   . Dysmenorrhea   . HSV-1 infection   . Blood dyscrasia     factor V  . Ovarian cyst    Past Surgical History  Procedure Laterality Date  . Orif of right fib  2011    Open Reduction of the right fibula  . Appendectomy  1998  . Wisdom tooth extraction  2012  . Cystectomy Right 01/07/2013    Laparoscopic  . Laparoscopy Right 01/07/2013    Procedure: LAPAROSCOPY OPERATIVE;  Surgeon: Azalia Bilis, MD;  Location: [facility];  Service: Gynecology;  Laterality: Right;  ovarian cystectomy  . Intrauterine device (iud) insertion N/A 01/07/2013    Procedure: INTRAUTERINE DEVICE (IUD) INSERTION;  Surgeon: Azalia Bilis, MD;  Location: [facility];  Service: Gynecology;  Laterality: N/A;  . Laparoscopic ovarian cystectomy Right 09/28/2013    Procedure: LAPAROSCOPIC RIGHT OOPHORECTOMY;  Surgeon: Azalia Bilis, MD;  Location: [facility];  Service: Gynecology;  Laterality: Right;   Family History  Problem Relation Age of Onset  . Hypertension Mother   . Cancer Maternal Grandmother     pancreatic  . Diabetes Maternal Grandmother   . Pancreatic cancer Maternal  Grandmother   . Cancer Paternal Grandmother   . Breast cancer Paternal Grandmother   . Lung cancer Paternal Grandfather    History  Substance Use Topics  . Smoking status: Never Smoker   . Smokeless tobacco: Never Used  . Alcohol Use: Yes     Comment: socially    OB History   Grav Para Term Preterm Abortions TAB SAB Ect Mult Living   0 0 0 0 0 0 0 0 0 0      Review of Systems  Constitutional: Negative for fever.  Respiratory: Negative for cough and shortness of breath.   Gastrointestinal: Negative for vomiting.  All other systems reviewed and are negative.      Allergies  Ivp dye  Home Medications   Current Outpatient Rx  Name  Route  Sig  Dispense  Refill  . BIOTIN PO   Oral   Take 1 tablet by mouth daily.         Marland Kitchen HYDROcodone-acetaminophen (LORTAB) 7.5-325 MG per tablet   Oral   Take 1 tablet by mouth every 6 (six) hours as needed for moderate pain.   30 tablet   0   . methylphenidate (CONCERTA) 54 MG CR tablet   Oral   Take 54 mg by mouth every morning.         [name] (EYE ALLERGY  RELIEF OP)   Ophthalmic   Apply 1 drop to eye daily.          BP 136/74  Pulse 93  Temp(Src) 99 F (37.2 C)  Resp 16  Ht 5\' 6"  (1.676 m)  Wt 190 lb (86.183 kg)  BMI 30.68 kg/m2  SpO2 100%  LMP 09/19/2013 Physical Exam  Nursing note and vitals reviewed. Constitutional: She is oriented to person, place, and time. She appears well-developed and well-nourished. No distress.  HENT:  Head: Normocephalic and atraumatic.  Eyes: EOM are normal. Pupils are equal, round, and reactive to light.  Neck: Normal range of motion. Neck supple.  Cardiovascular: Normal rate and regular rhythm.  Exam reveals no friction rub.   No murmur heard. Pulmonary/Chest: Effort normal and breath sounds normal. No respiratory distress. She has no wheezes. She has no rales.  Abdominal: Soft. She exhibits no distension. There is no tenderness. There is no rebound.   Musculoskeletal: Normal range of motion. She exhibits tenderness (L posterior calf). She exhibits no edema.  Neurological: She is alert and oriented to person, place, and time. No cranial nerve deficit. She exhibits normal muscle tone. Coordination normal.  Skin: No rash noted. She is not diaphoretic.    ED Course  Procedures (including critical care time) Labs Review Labs Reviewed  CBC  BASIC METABOLIC PANEL   Imaging Review US Venous Img Lower Unilateral Left  10/07/2013   CLINICAL DATA Factor 5 Leiden deficiency.  Left calf pain.  EXAM Left LOWER EXTREMITY VENOUS DOPPLER ULTRASOUND  TECHNIQUE Gray-scale sonography with graded compression, as well as color Doppler and duplex ultrasound, were performed to evaluate the deep venous system from the level of the common femoral vein through the popliteal and proximal calf veins. Spectral Doppler was utilized to evaluate flow at rest and with distal augmentation maneuvers.  COMPARISON None.  FINDINGS Thrombus within deep veins: There is thrombus identified in the left peroneal vein. There is no other thrombus visualized.  Compressibility of deep veins: The left peroneal vein is noncompressible. The remainder of the deep venous system is compressible.  Duplex waveform respiratory phasicity:  Normal.  Duplex waveform response to augmentation:  Normal.  Venous reflux:  None visualized.  Other findings: There is no color flow identified within the left peroneal vein.  IMPRESSION 1. Occlusive thrombosis of the left peroneal vein.  SIGNATURE  Electronically Signed   By: Kathreen Devoid   On: 10/07/2013 13:53     EKG Interpretation None      MDM   Final diagnoses:  DVT (deep venous thrombosis)    36F presents with DVT. Known Factor V Leiden disease, had R oopherectomy 10 days ago. Had leg pain since surgery, [place] today shows L sided peroneal vein DVT. I spoke with her PCP, Dr. Coralyn Mark, who would like patient admitted for lovenox bridge to coumadin.  Labs drawn, will admit to [facility] hospital.    Osvaldo Shipper, MD 10/07/13 [phone]

## 2013-10-08 ENCOUNTER — Other Ambulatory Visit: Payer: Self-pay | Admitting: Hematology and Oncology

## 2013-10-08 ENCOUNTER — Inpatient Hospital Stay (HOSPITAL_COMMUNITY): Payer: BC Managed Care – PPO

## 2013-10-08 ENCOUNTER — Ambulatory Visit: Payer: BC Managed Care – PPO | Admitting: Internal Medicine

## 2013-10-08 ENCOUNTER — Encounter (HOSPITAL_COMMUNITY): Payer: Self-pay | Admitting: Hematology and Oncology

## 2013-10-08 DIAGNOSIS — D6851 Activated protein C resistance: Secondary | ICD-10-CM

## 2013-10-08 DIAGNOSIS — M79609 Pain in unspecified limb: Secondary | ICD-10-CM

## 2013-10-08 DIAGNOSIS — D6859 Other primary thrombophilia: Secondary | ICD-10-CM

## 2013-10-08 DIAGNOSIS — D72829 Elevated white blood cell count, unspecified: Secondary | ICD-10-CM

## 2013-10-08 DIAGNOSIS — I82409 Acute embolism and thrombosis of unspecified deep veins of unspecified lower extremity: Secondary | ICD-10-CM

## 2013-10-08 LAB — URINALYSIS, ROUTINE W REFLEX MICROSCOPIC
BILIRUBIN URINE: NEGATIVE
GLUCOSE, UA: NEGATIVE mg/dL
Hgb urine dipstick: NEGATIVE
Ketones, ur: NEGATIVE mg/dL
Leukocytes, UA: NEGATIVE
Nitrite: NEGATIVE
PROTEIN: NEGATIVE mg/dL
SPECIFIC GRAVITY, URINE: 1.025 (ref 1.005–1.030)
UROBILINOGEN UA: 0.2 mg/dL (ref 0.0–1.0)
pH: 6.5 (ref 5.0–8.0)

## 2013-10-08 LAB — COMPREHENSIVE METABOLIC PANEL
ALK PHOS: 36 U/L — AB (ref 39–117)
ALT: 8 U/L (ref 0–35)
AST: 13 U/L (ref 0–37)
Albumin: 3.2 g/dL — ABNORMAL LOW (ref 3.5–5.2)
BILIRUBIN TOTAL: 0.3 mg/dL (ref 0.3–1.2)
BUN: 12 mg/dL (ref 6–23)
CO2: 25 meq/L (ref 19–32)
Calcium: 8.8 mg/dL (ref 8.4–10.5)
Chloride: 104 mEq/L (ref 96–112)
Creatinine, Ser: 0.8 mg/dL (ref 0.50–1.10)
GLUCOSE: 92 mg/dL (ref 70–99)
POTASSIUM: 3.5 meq/L — AB (ref 3.7–5.3)
Sodium: 140 mEq/L (ref 137–147)
Total Protein: 6.3 g/dL (ref 6.0–8.3)

## 2013-10-08 LAB — CBC WITH DIFFERENTIAL/PLATELET
BASOS ABS: 0.1 10*3/uL (ref 0.0–0.1)
Basophils Relative: 1 % (ref 0–1)
Eosinophils Absolute: 0.4 10*3/uL (ref 0.0–0.7)
Eosinophils Relative: 5 % (ref 0–5)
HCT: 36.2 % (ref 36.0–46.0)
Hemoglobin: 12.6 g/dL (ref 12.0–15.0)
Lymphocytes Relative: 44 % (ref 12–46)
Lymphs Abs: 3.7 10*3/uL (ref 0.7–4.0)
MCH: 30.2 pg (ref 26.0–34.0)
MCHC: 34.8 g/dL (ref 30.0–36.0)
MCV: 86.8 fL (ref 78.0–100.0)
MONO ABS: 0.6 10*3/uL (ref 0.1–1.0)
Monocytes Relative: 8 % (ref 3–12)
NEUTROS ABS: 3.5 10*3/uL (ref 1.7–7.7)
NEUTROS PCT: 42 % — AB (ref 43–77)
Platelets: 273 10*3/uL (ref 150–400)
RBC: 4.17 MIL/uL (ref 3.87–5.11)
RDW: 12.7 % (ref 11.5–15.5)
WBC: 8.3 10*3/uL (ref 4.0–10.5)

## 2013-10-08 LAB — PROTIME-INR
INR: 1.05 (ref 0.00–1.49)
Prothrombin Time: 13.5 seconds (ref 11.6–15.2)

## 2013-10-08 LAB — ANA: ANA: POSITIVE — AB

## 2013-10-08 LAB — TROPONIN I: Troponin I: <0.3 ng/mL (ref <0.30)

## 2013-10-08 LAB — ANTI-NUCLEAR AB-TITER (ANA TITER)

## 2013-10-08 MED ORDER — TECHNETIUM TC 99M DIETHYLENETRIAME-PENTAACETIC ACID: 40.0000 | Freq: Once | INTRAVENOUS | Status: AC | PRN

## 2013-10-08 MED ORDER — WARFARIN VIDEO
1.0000 | Freq: Once | Status: AC
  Administered 2013-10-09: 1

## 2013-10-08 MED ORDER — HYDROCODONE-ACETAMINOPHEN 5-325 MG PO TABS: 1.0000 | ORAL_TABLET | Freq: Once | ORAL | Status: DC

## 2013-10-08 MED ORDER — FLUCONAZOLE 150 MG PO TABS
150.0000 mg | ORAL_TABLET | Freq: Once | ORAL | Status: AC
  Administered 2013-10-08: 150 mg via ORAL
  Filled 2013-10-08 (×3): qty 1

## 2013-10-08 MED ORDER — MORPHINE SULFATE 2 MG/ML IJ SOLN
2.0000 mg | INTRAMUSCULAR | Status: DC | PRN
  Administered 2013-10-08: 2 mg via INTRAVENOUS
  Administered 2013-10-08: 4 mg via INTRAVENOUS
  Filled 2013-10-08: qty 1
  Filled 2013-10-08: qty 2

## 2013-10-08 MED ORDER — WARFARIN SODIUM 7.5 MG PO TABS
7.5000 mg | ORAL_TABLET | Freq: Once | ORAL | Status: AC
  Administered 2013-10-08: 7.5 mg via ORAL
  Filled 2013-10-08: qty 1

## 2013-10-08 MED ORDER — COUMADIN BOOK
1.0000 | Freq: Once | Status: AC
  Administered 2013-10-08: 1
  Filled 2013-10-08: qty 1

## 2013-10-08 MED ORDER — OXYCODONE-ACETAMINOPHEN 5-325 MG PO TABS
1.0000 | ORAL_TABLET | Freq: Four times a day (QID) | ORAL | Status: DC | PRN
  Administered 2013-10-09 (×3): 1 via ORAL
  Filled 2013-10-08 (×3): qty 1

## 2013-10-08 MED ORDER — TECHNETIUM TO 99M ALBUMIN AGGREGATED: 6.0000 | Freq: Once | INTRAVENOUS | Status: AC | PRN

## 2013-10-08 NOTE — Progress Notes (Signed)
Pt arrived to unit approximately at 2130 with mother. Pt oriented and alert to room, educated about call bell. Pt started complaining of SOB and chest pain, hard to breathe, sharp stabbing pain in chest. Called rapid response, and notified MD, obtained EKG and placed O2 nasal cannula at 2L. Pt started feeling better, and Dr. Ernestina Patches aware. Pt complains of pain in left upper and lower extremities. Morphine and ativan given per orders. Pain in L arm not decreased with delivery of morphine so order for L upper extremity ultrasound placed. Will continue to monitor.

## 2013-10-08 NOTE — Progress Notes (Signed)
[facility] PROGRESS NOTE  Julie Doyle WUJ:[id number] DOB: [birth year] DOA: 10/07/2013 PCP: Kelton Pillar, MD  Assessment/Plan: #1 LLE DVT ?? Etiology. Patient with factor V LEIDEN disease. Patient with recent surgery. ?? Provoked secondary to recent surgery. Patient on Lovenox bridge with Coumadin. We'll consult with hematology for further evaluation and management. Follow.  #2 chest pain Likely anxiety provoked. VQ scan with low probability for PE. Patient also does Lovenox and Coumadin.   #3 leukocytosis Resolved.  Code Status: Full Family Communication: Updated patient at bedside. No family present. Disposition Plan: Home when medically stable.   Consultants:  Hematology pending.  Procedures:  V/Q scan 10/08/13  Chest x-ray 10/08/2013  LUE [place] 10/08/13  Antibiotics:  None  HPI/Subjective: Patient sleeping. Easily arousable. C/O leg pain.  Objective: Filed Vitals:   10/08/13 1036  BP: 104/67  Pulse: 87  Temp: 98.9 F (37.2 C)  Resp: 18    Intake/Output Summary (Last 24 hours) at 10/08/13 1703 Last data filed at 10/08/13 1212  Gross per 24 hour  Intake      0 ml  Output    600 ml  Net   -600 ml   Filed Weights   10/07/13 1434  Weight: 86.183 kg (190 lb)    Exam:   General:  NAD  Cardiovascular: RRR  Respiratory: CTAB  Abdomen: Soft/NT/ND/+BS  Musculoskeletal: No c/c/e  Data Reviewed: Basic Metabolic Panel:  Recent Labs Lab 10/07/13 1615 10/08/13 0645  NA 142 140  K 4.2 3.5*  CL 103 104  CO2 25 25  GLUCOSE 98 92  BUN 12 12  CREATININE 0.70 0.80  CALCIUM 9.6 8.8   Liver Function Tests:  Recent Labs Lab 10/08/13 0645  AST 13  ALT 8  ALKPHOS 36*  BILITOT 0.3  PROT 6.3  ALBUMIN 3.2*   No results found for this basename: LIPASE, AMYLASE,  in the last 168 hours No results found for this basename: AMMONIA,  in the last 168 hours CBC:  Recent Labs Lab 10/07/13 1615 10/08/13 0645  WBC 11.2* 8.3  NEUTROABS   --  3.5  HGB 14.3 12.6  HCT 41.4 36.2  MCV 87.9 86.8  PLT 336 273   Cardiac Enzymes:  Recent Labs Lab 10/07/13 2239 10/08/13 0645 10/08/13 1220  TROPONINI <0.30 <0.30 <0.30   BNP (last 3 results) No results found for this basename: PROBNP,  in the last 8760 hours CBG: No results found for this basename: GLUCAP,  in the last 168 hours  Recent Results (from the past 240 hour(s))  URINE CULTURE     Status: None   Collection Time    10/06/13  1:03 PM      Result Value Ref Range Status   Colony Count NO GROWTH   Final   Organism ID, Bacteria NO GROWTH   Final     Studies: Dg Chest 2 View  10/08/2013   CLINICAL DATA Shortness of breath, chest pain.  EXAM CHEST  2 VIEW  COMPARISON 05/17/2004  FINDINGS Heart size and mediastinal contours within normal range. No confluent airspace opacity, pleural effusion, or pneumothorax. Gentle right curvature of the lower thoracic/ upper lumbar spine. No acute osseous finding.  IMPRESSION No radiographic evidence of an acute cardiopulmonary process.  SIGNATURE  Electronically Signed   By: Carlos Levering M.D.   On: 10/08/2013 04:44   Nm Pulmonary Perf And Vent  10/08/2013   CLINICAL DATA Postop week 1, positive DVT. Shortness of breath and chest pain. Unable to have CT  contrast due to allergy.  EXAM NUCLEAR MEDICINE VENTILATION - PERFUSION LUNG SCAN  TECHNIQUE Ventilation images were obtained in multiple projections using inhaled aerosol technetium 99 M DTPA. Perfusion images were obtained in multiple projections after intravenous injection of Tc-14m MAA.  RADIOPHARMACEUTICALS Forty mCi Tc-5m DTPA aerosol and 6 mCi Tc-49m MAA  COMPARISON Same day chest radiograph  FINDINGS Ventilation: No focal ventilation defect.  Perfusion: No wedge shaped peripheral perfusion defects to suggest acute pulmonary embolism.  IMPRESSION Low probability for pulmonary embolism.  SIGNATURE  Electronically Signed   By: Carlos Levering M.D.   On: 10/08/2013 04:21   US  Venous Img Lower Unilateral Left  10/07/2013   CLINICAL DATA Factor 5 Leiden deficiency.  Left calf pain.  EXAM Left LOWER EXTREMITY VENOUS DOPPLER ULTRASOUND  TECHNIQUE Gray-scale sonography with graded compression, as well as color Doppler and duplex ultrasound, were performed to evaluate the deep venous system from the level of the common femoral vein through the popliteal and proximal calf veins. Spectral Doppler was utilized to evaluate flow at rest and with distal augmentation maneuvers.  COMPARISON None.  FINDINGS Thrombus within deep veins: There is thrombus identified in the left peroneal vein. There is no other thrombus visualized.  Compressibility of deep veins: The left peroneal vein is noncompressible. The remainder of the deep venous system is compressible.  Duplex waveform respiratory phasicity:  Normal.  Duplex waveform response to augmentation:  Normal.  Venous reflux:  None visualized.  Other findings: There is no color flow identified within the left peroneal vein.  IMPRESSION 1. Occlusive thrombosis of the left peroneal vein.  SIGNATURE  Electronically Signed   By: Kathreen Devoid   On: 10/07/2013 13:53    Scheduled Meds: . coumadin book  1 each Does not apply Once  . enoxaparin (LOVENOX) injection  90 mg Subcutaneous Q12H  . methylphenidate  54 mg Oral Q breakfast  . senna  2 tablet Oral Daily  . sodium chloride  3 mL Intravenous Q12H  . warfarin  7.5 mg Oral ONCE-1800  . [START ON 10/09/2013] warfarin  1 each Does not apply Once  . Warfarin - Pharmacist Dosing Inpatient   Does not apply q1800   Continuous Infusions:   Principal Problem:   DVT (deep venous thrombosis) Active Problems:   Factor 5 Leiden mutation, heterozygous   Anxiety state, unspecified   Leukocytosis, unspecified    Time spent: [place] MD [facility] Pager [phone]. If 7PM-7AM, please contact night-coverage at [email or web address], password [facility] 10/08/2013, 5:03 PM  LOS: 1 day

## 2013-10-08 NOTE — Progress Notes (Signed)
Received call at 0425 about the order for the ultrasound of L upper extremity being the wrong order. Was told that the order should be under vascular and not imaging. However, could not find an ultrasound order without it being under imaging. Left message on ultrasound's voicemail. Will pass along to day shift nurse.

## 2013-10-08 NOTE — Progress Notes (Signed)
*  PRELIMINARY RESULTS* Vascular Ultrasound Left upper extremity venous duplex has been completed.  Preliminary findings:   No evidence of DVT or superficial thrombosis.     Landry Mellow, RDMS, RVT  10/08/2013, 11:51 AM

## 2013-10-08 NOTE — Progress Notes (Signed)
Pharmacy Note-Anticoagulation  Pharmacy Consult :  28 y.o. female is currently on Coumadin with Lovenox bridging for new DVT.   Pt Weight:  86 kg  Latest Labs : Hematology :  Recent Labs  10/07/13 1615 10/07/13 2300 10/08/13 0645  HGB 14.3  --  12.6  HCT 41.4  --  36.2  PLT 336  --  273  LABPROT  --  12.1 13.5  INR  --  0.91 1.05  CREATININE 0.70  --  0.80    Lab Results  Component Value Date   INR 1.05 10/08/2013   INR 0.91 10/07/2013        HGB 12.6 10/08/2013   HGB 14.3 10/07/2013   HGB 12.8 09/28/2013    Current Medication[s] Include: Medication PTA: Prescriptions prior to admission  Medication Sig Dispense Refill  . BIOTIN PO Take 1 tablet by mouth daily.      Marland Kitchen HYDROcodone-acetaminophen (LORTAB) 7.5-325 MG per tablet Take 1 tablet by mouth every 6 (six) hours as needed for moderate pain.  30 tablet  0  . methylphenidate (CONCERTA) 54 MG CR tablet Take 54 mg by mouth every morning.      [name] (EYE ALLERGY RELIEF OP) Apply 1-2 drops to eye daily as needed (for allergies/irritation).        Scheduled:  Scheduled:  . enoxaparin (LOVENOX) injection  90 mg Subcutaneous Q12H  . methylphenidate  54 mg Oral Q breakfast  . senna  2 tablet Oral Daily  . sodium chloride  3 mL Intravenous Q12H  . Warfarin - Pharmacist Dosing Inpatient   Does not apply q1800   Antibiotic[s]: Anti-infectives   Start     Dose/Rate Route Frequency Ordered Stop   10/08/13 0100  fluconazole (DIFLUCAN) tablet 150 mg     150 mg Oral  Once 10/08/13 0014 10/08/13 0741      Assessment :  Today's INR is subtherapeutic following 1st dose.   INR is 1.05.    No bleeding complications observed.  Goal :  INR goal is 2-3    Lovenox Anti-Xa level 0.6-1 units/ml 4hrs after LMWH dose given  Plan : 1. Lovenox 90 mg sq q 12 hours. 2. Coumadin 7.5 mg today. 3. Daily INR's, CBC. Monitor for bleeding complications   Estelle June, Pharm.D. 10/08/2013  1:00 PM

## 2013-10-08 NOTE — Progress Notes (Signed)
UR complete.  Azile Minardi RN, MSN 

## 2013-10-08 NOTE — Consult Note (Signed)
[facility] CONSULT NOTE  Patient Care Team: Kendrick Ranch, MD as PCP - General (Internal Medicine)  CHIEF COMPLAINTS/PURPOSE OF CONSULTATION:  Left lower extremity DVT, heterozygous for factor V 5 Leiden mutation  HISTORY OF PRESENTING ILLNESS:  Julie Doyle 28 y.o. female is here because of newly diagnosed left lower extremity DVT. This patient had laparoscopic resection of ovarian cyst 10 days ago. After her surgery, the patient notice left lower extremity swelling and pain approximately 4 days ago. She presented to the physician office and ultrasound confirmed acute left lower extremity DVT effecting the peroneal vein. Ultrasound of the lower extremity confirmed the diagnosis. VQ scan showed low probability of PE. D-dimer is elevated. Due to your family history of blood clotting disorder, the patient was tested in the past and was found to have heterozygous mutation for factor V 5 Leiden. She was never diagnosed with blood clot prior to this. Last July, she had a fracture of her right lower extremity and had aggressive DVT prophylaxis and denies blood clot at that time. The patient had IUD implanted around the same time for history of menorrhagia. She denies recent chest pain on exertion, shortness of breath on minimal exertion, pre-syncopal episodes, hemoptysis, or palpitation. Currently, she had persistent left lower extremity pain and swelling. She denies smoking history.  She had no prior history or diagnosis of cancer. Her age appropriate screening programs are up-to-date. She had prior surgeries before and never had perioperative thromboembolic events.  The patient had never been pregnant before. There is family history of blood clots in her mother and multiple family members also tested positive for factor V Leiden mutation.  MEDICAL HISTORY:  Past Medical History  Diagnosis Date  . Factor 5 Leiden mutation, heterozygous   . Anxiety   . Clotting  disorder   . Dysmenorrhea   . HSV-1 infection   . Blood dyscrasia     factor V  . Ovarian cyst     SURGICAL HISTORY: Past Surgical History  Procedure Laterality Date  . Orif of right fib  2011    Open Reduction of the right fibula  . Appendectomy  1998  . Wisdom tooth extraction  2012  . Cystectomy Right 01/07/2013    Laparoscopic  . Laparoscopy Right 01/07/2013    Procedure: LAPAROSCOPY OPERATIVE;  Surgeon: Bennye Alm, MD;  Location: [facility];  Service: Gynecology;  Laterality: Right;  ovarian cystectomy  . Intrauterine device (iud) insertion N/A 01/07/2013    Procedure: INTRAUTERINE DEVICE (IUD) INSERTION;  Surgeon: Bennye Alm, MD;  Location: [facility];  Service: Gynecology;  Laterality: N/A;  . Laparoscopic ovarian cystectomy Right 09/28/2013    Procedure: LAPAROSCOPIC RIGHT OOPHORECTOMY;  Surgeon: Bennye Alm, MD;  Location: [facility];  Service: Gynecology;  Laterality: Right;    SOCIAL HISTORY: History   Social History  . Marital Status: Single    Spouse Name: N/A    Number of Children: N/A  . Years of Education: N/A   Occupational History  . Not on file.   Social History Main Topics  . Smoking status: Never Smoker   . Smokeless tobacco: Never Used  . Alcohol Use: Yes     Comment: socially   . Drug Use: No  . Sexual Activity: Yes    Birth Control/ Protection: Condom   Other Topics Concern  . Not on file   Social History Narrative  . No narrative on file    FAMILY HISTORY: Family History  Problem Relation Age of Onset  . Hypertension Mother   . Clotting disorder Mother     DVT  . Cancer Maternal Grandmother     pancreatic  . Diabetes Maternal Grandmother   . Pancreatic cancer Maternal Grandmother   . Cancer Paternal Grandmother   . Breast cancer Paternal Grandmother   . Lung cancer Paternal Grandfather     ALLERGIES:  is allergic to ivp dye.  MEDICATIONS:  Current Facility-Administered Medications  Medication Dose Route Frequency Provider  Last Rate Last Dose  . coumadin book 1 each  1 each Does not apply Once Eugenie Filler, MD      . enoxaparin (LOVENOX) injection 90 mg  90 mg Subcutaneous Q12H Shanda Howells, MD   90 mg at 10/08/13 0953  . LORazepam (ATIVAN) tablet 0.5 mg  0.5 mg Oral Q8H PRN Shanda Howells, MD   0.5 mg at 10/08/13 0022   Or  . LORazepam (ATIVAN) injection 0.5 mg  0.5 mg Intramuscular Q8H PRN Shanda Howells, MD      . methylphenidate (CONCERTA) CR tablet 54 mg  54 mg Oral Q breakfast Shanda Howells, MD      . morphine 2 MG/ML injection 2-4 mg  2-4 mg Intravenous Q3H PRN Shanda Howells, MD   4 mg at 10/08/13 1728  . naphazoline-pheniramine (NAPHCON-A) 0.025-0.3 % ophthalmic solution 1 drop  1 drop Both Eyes QID PRN Shanda Howells, MD      . senna [facility]) tablet 17.2 mg  2 tablet Oral Daily Shanda Howells, MD   17.2 mg at 10/08/13 0954  . sodium chloride 0.9 % injection 3 mL  3 mL Intravenous Q12H Shanda Howells, MD   3 mL at 10/08/13 0957  . technetium albumin aggregated (MAA) injection solution 6 milli Curie  6 milli Curie Intravenous Once PRN Medication Radiologist, MD      . technetium TC 4M diethylenetriame-pentaacetic acid (DTPA) injection 40 milli Curie  40 milli Curie Intravenous Once PRN Medication Radiologist, MD      . warfarin (COUMADIN) tablet 7.5 mg  7.5 mg Oral ONCE-1800 Eugenie Filler, MD      . Derrill Memo ON 10/09/2013] warfarin (COUMADIN) video 1 each  1 each Does not apply Once Eugenie Filler, MD      . Warfarin - Pharmacist Dosing Inpatient   Does not apply [id number] Shanda Howells, MD        REVIEW OF SYSTEMS:   Constitutional: Denies chills or abnormal night sweats. She has been complaining of intermittent low-grade fever for several months. Eyes: Denies blurriness of vision, double vision or watery eyes Ears, nose, mouth, throat, and face: Denies mucositis or sore throat Respiratory: Denies cough, dyspnea or wheezes Cardiovascular: Denies palpitation, chest discomfort Gastrointestinal:  Denies  nausea, heartburn or change in bowel habits Skin: Denies abnormal skin rashes Lymphatics: Denies new lymphadenopathy or easy bruising Neurological:Denies numbness, tingling or new weaknesses Behavioral/Psych: Mood is stable, no new changes  All other systems were reviewed with the patient and are negative.  PHYSICAL EXAMINATION: ECOG PERFORMANCE STATUS: 0 - Asymptomatic  Filed Vitals:   10/08/13 1036  BP: 104/67  Pulse: 87  Temp: 98.9 F (37.2 C)  Resp: 18   Filed Weights   10/07/13 1434  Weight: 190 lb (86.183 kg)    GENERAL:alert, no distress and comfortable. She is morbidly obese SKIN: skin color, texture, turgor are normal, no rashes or significant lesions EYES: normal, conjunctiva are pink and non-injected, sclera clear OROPHARYNX:no exudate, no erythema and lips,  buccal mucosa, and tongue normal  NECK: supple, thyroid normal size, non-tender, without nodularity LYMPH:  no palpable lymphadenopathy in the cervical, axillary or inguinal LUNGS: clear to auscultation and percussion with normal breathing effort HEART: regular rate & rhythm and no murmurs. Noted mild left lower extremity edema ABDOMEN:abdomen soft, non-tender and normal bowel sounds Musculoskeletal:no cyanosis of digits and no clubbing  PSYCH: alert & oriented x 3 with fluent speech NEURO: no focal motor/sensory deficits  LABORATORY DATA:  I have reviewed the data as listed Lab Results  Component Value Date   WBC 8.3 10/08/2013   HGB 12.6 10/08/2013   HCT 36.2 10/08/2013   MCV 86.8 10/08/2013   PLT 273 10/08/2013     RADIOGRAPHIC STUDIES: I have reviewed the data as follows Nm Pulmonary Perf And Vent  10/08/2013   CLINICAL DATA Postop week 1, positive DVT. Shortness of breath and chest pain. Unable to have CT contrast due to allergy.  EXAM NUCLEAR MEDICINE VENTILATION - PERFUSION LUNG SCAN  TECHNIQUE Ventilation images were obtained in multiple projections using inhaled aerosol technetium 99 M DTPA.  Perfusion images were obtained in multiple projections after intravenous injection of Tc-39m MAA.  RADIOPHARMACEUTICALS Forty mCi Tc-38m DTPA aerosol and 6 mCi Tc-3m MAA  COMPARISON Same day chest radiograph  FINDINGS Ventilation: No focal ventilation defect.  Perfusion: No wedge shaped peripheral perfusion defects to suggest acute pulmonary embolism.  IMPRESSION Low probability for pulmonary embolism.  SIGNATURE  Electronically Signed   By: Carlos Levering M.D.   On: 10/08/2013 04:21   US Venous Img Lower Unilateral Left  10/07/2013   CLINICAL DATA Factor 5 Leiden deficiency.  Left calf pain.  EXAM Left LOWER EXTREMITY VENOUS DOPPLER ULTRASOUND  TECHNIQUE Gray-scale sonography with graded compression, as well as color Doppler and duplex ultrasound, were performed to evaluate the deep venous system from the level of the common femoral vein through the popliteal and proximal calf veins. Spectral Doppler was utilized to evaluate flow at rest and with distal augmentation maneuvers.  COMPARISON None.  FINDINGS Thrombus within deep veins: There is thrombus identified in the left peroneal vein. There is no other thrombus visualized.  Compressibility of deep veins: The left peroneal vein is noncompressible. The remainder of the deep venous system is compressible.  Duplex waveform respiratory phasicity:  Normal.  Duplex waveform response to augmentation:  Normal.  Venous reflux:  None visualized.  Other findings: There is no color flow identified within the left peroneal vein.  IMPRESSION 1. Occlusive thrombosis of the left peroneal vein.  SIGNATURE  Electronically Signed   By: Kathreen Devoid   On: 10/07/2013 13:53    ASSESSMENT:  Provoked left lower extremity DVT, on background history of factor V Leiden heterozygous mutation status  PLAN:  #1 left lower extremity DVT I reviewed with the patient about the plan for care for DVT.  This episode of blood clot appeared to be provoked by recent surgery. The patient  was tested positive for factor V Leiden mutation but is only a carrier. She also has an implant the IUD.  We discussed about various options of anticoagulation therapies including warfarin, low molecular weight heparin such as Lovenox or newer agents such as Rivaroxaban. Some of the risks and benefits discussed including costs involved, the need for monitoring, risks of life-threatening bleeding/hospitalization, reversibility of each agent in the event of bleeding or overdose, safety profile of each drug and taking into account other social issues such as ease of administration of medications, etc.  The patient appeared to be comfortable with warfarin for now. Duration of treatment would be 3-6 months. I am concerned about the presence of her IUD. We discussed about the pros and cons of removing the IUD. At present time, the patient once the IUD in place until she completes her anticoagulation therapy, know when that that could be a contribution factor to her blood clot.  I recommend the patient to use elastic compression stockings at 20-30 mmHg to reduce risks of chronic thrombophlebitis.  Finally, at the end of our consultation today, I reinforced the importance of preventive strategies such as avoiding hormonal supplement, avoiding cigarette smoking, keeping up-to-date with screening programs for early cancer detection, frequent ambulation for long distance travel and aggressive DVT prophylaxis in all surgical settings.  I have made a return appointment for the patient to come back to see me in the clinic in 3 months with repeat ultrasound of the lower extremity.  #2 low-grade fever She has no evidence of leukocytosis. I reviewed her blood work and noted that the patient tested positive for ANA. I am not certain about the significance of this. I spoke with the hospitalist to discuss further with the patient whether she needs to be referred to a rheumatologist in the outpatient setting.   All  questions were answered. The patient knows to call the clinic with any problems, questions or concerns. I spent 55 minutes counseling the patient face to face. The total time spent in the appointment was 60 minutes and more than 50% was on counseling.     Tymika Grilli, MD @T @ 5:55 PM

## 2013-10-09 ENCOUNTER — Telehealth: Payer: Self-pay | Admitting: Hematology and Oncology

## 2013-10-09 ENCOUNTER — Telehealth: Payer: Self-pay | Admitting: Internal Medicine

## 2013-10-09 DIAGNOSIS — F411 Generalized anxiety disorder: Secondary | ICD-10-CM

## 2013-10-09 LAB — BASIC METABOLIC PANEL
BUN: 11 mg/dL (ref 6–23)
CALCIUM: 8.7 mg/dL (ref 8.4–10.5)
CO2: 22 mEq/L (ref 19–32)
CREATININE: 0.59 mg/dL (ref 0.50–1.10)
Chloride: 103 mEq/L (ref 96–112)
GFR calc non Af Amer: >90 mL/min (ref >90)
Glucose, Bld: 94 mg/dL (ref 70–99)
POTASSIUM: 3.8 meq/L (ref 3.7–5.3)
Sodium: 138 mEq/L (ref 137–147)

## 2013-10-09 LAB — PROTIME-INR
INR: 1.06 (ref 0.00–1.49)
Prothrombin Time: 13.6 seconds (ref 11.6–15.2)

## 2013-10-09 LAB — CBC
HCT: 37.2 % (ref 36.0–46.0)
Hemoglobin: 12.9 g/dL (ref 12.0–15.0)
MCH: 30.1 pg (ref 26.0–34.0)
MCHC: 34.7 g/dL (ref 30.0–36.0)
MCV: 86.7 fL (ref 78.0–100.0)
Platelets: 279 10*3/uL (ref 150–400)
RBC: 4.29 MIL/uL (ref 3.87–5.11)
RDW: 12.7 % (ref 11.5–15.5)
WBC: 8.8 10*3/uL (ref 4.0–10.5)

## 2013-10-09 MED ORDER — WARFARIN SODIUM 5 MG PO TABS: 10.0000 mg | ORAL_TABLET | Freq: Once | ORAL | Status: DC

## 2013-10-09 MED ORDER — OXYCODONE-ACETAMINOPHEN 5-325 MG PO TABS
1.0000 | ORAL_TABLET | ORAL | Status: DC | PRN
  Administered 2013-10-09: 1 via ORAL
  Filled 2013-10-09: qty 1

## 2013-10-09 MED ORDER — COUMADIN BOOK
1.0000 | Freq: Once | Status: AC
  Administered 2013-10-09: 1
  Filled 2013-10-09: qty 1

## 2013-10-09 MED ORDER — WARFARIN SODIUM 10 MG PO TABS
10.0000 mg | ORAL_TABLET | Freq: Once | ORAL | Status: AC
  Administered 2013-10-09: 10 mg via ORAL
  Filled 2013-10-09: qty 1

## 2013-10-09 MED ORDER — HYDROCODONE-ACETAMINOPHEN 7.5-325 MG PO TABS: 1.0000 | ORAL_TABLET | Freq: Four times a day (QID) | ORAL | Status: DC | PRN

## 2013-10-09 MED ORDER — FLUCONAZOLE 150 MG PO TABS
150.0000 mg | ORAL_TABLET | Freq: Once | ORAL | Status: AC
  Administered 2013-10-09: 150 mg via ORAL
  Filled 2013-10-09: qty 1

## 2013-10-09 MED ORDER — MICONAZOLE NITRATE 2 % VA CREA: 1.0000 | TOPICAL_CREAM | Freq: Every day | VAGINAL | Status: DC

## 2013-10-09 MED ORDER — ENOXAPARIN (LOVENOX) PATIENT EDUCATION KIT
PACK | Freq: Once | Status: AC
  Administered 2013-10-09: 09:00:00
  Filled 2013-10-09: qty 1

## 2013-10-09 MED ORDER — WARFARIN VIDEO: 1.0000 | Freq: Once | Status: DC

## 2013-10-09 MED ORDER — ENOXAPARIN SODIUM 150 MG/ML ~~LOC~~ SOLN: 130.0000 mg | SUBCUTANEOUS | Status: DC

## 2013-10-09 NOTE — Discharge Summary (Addendum)
Physician Discharge Summary  Julie Doyle [id number] DOB: 12/15/1985 DOA: 10/07/2013  PCP: Kelton Pillar, MD  Admit date: 10/07/2013 Discharge date: 10/09/2013  Time spent: 65 minutes  Recommendations for Outpatient Follow-up:  1. Followup with SCHOENHOFF,DEBBIE, MD in 1 week. 2. Patient will need a PT/INR checked tomorrow 10/10/2013 and results called into PCP for further management. Patient is being discharged in a Lovenox bridge with Coumadin. 3. Patient is to followup with Dr. Alvy Bimler of hematology 01/08/2014.  Discharge Diagnoses:  Principal Problem:   DVT (deep venous thrombosis) Active Problems:   Factor 5 Leiden mutation, heterozygous   Anxiety state, unspecified   Leukocytosis, unspecified   Discharge Condition: Stable and improved  Diet recommendation: Regular  Filed Weights   10/07/13 1434  Weight: 86.183 kg (190 lb)    History of present illness:  This is a 28 y.o. year old female with prior hx/o factor V leiden disease, symptomatic ovarian cysts s/p laproscopic oopherectomy 09/28/13 presenting with DVT. Pt states that she had prior hx/o factor V leiden disease. Had lap oopherectomy last week. Was hospitalized overnight. States that she was not placed on anticoagulation. Has had LLE swelling and pain over past 3-4 days. Was sent for LE [place] by GYN. Had L peroneal vein DVT on u/s. Was sent to ER for anticoagulation w/ lovenox. Gyn requested that pt be admitted for lovenox bridging to coumadin per report. On arrival to [facility], pt developed some central CP and SOB. Rapid response called. Mildly tachycardic with no hypoxia. EKG NSR. Pt reported that she was anxious. Given 1mg  ativan with improvement in CP and SOB. Still with some CP and SOB. No nausea or diaphoresis. CEs pending.    Hospital Course:  #1 left lower extremity DVT Patient was sent by GYN to the hospital secondary to left lower extremity DVT noted on lower extremity Doppler ultrasound. It was noted that  patient had a left peroneal vein DVT. Patient was started on Lovenox bridge with Coumadin. Patient on admission had some complaints of chest and shortness of breath with some tachycardia and hypoxia which was relieved with Ativan. Due to patient's lower extremity DVT VQ scan was obtained with low probability for PE. Patient did have a history of factor V Leiden disease and a such a hematology consultation was obtained and patient was seen in consultation by Dr. Alvy Bimler on 10/08/2013. Patient was noted to also have an Amplatz with the IUD. It was recommended that patient may be treated with Coumadin with a Lovenox bridge this patient. Comfortable with as opposed to when he was agents. It was felt duration of treatment will be 3-6 months. Was also recommended that patient get elastic compression stockings to reduce the risk of chronic thrombophlebitis. Patient improved clinically and will be discharged home on a Lovenox bridge with Coumadin. Patient need a PT/INR checked tomorrow 10/10/2013 results called in to her PCPs office. Patient be discharged on Coumadin 10 mg daily. Patient is also to followup with hematology as outpatient in 3 months.  #2 low-grade fever/leukocytosis Patient with no further evidence of leukocytosis which was noted on admission. It was noted on review of blood work that patient had a positive ANA done by PCP. A rheumatoid factor which was done was less than 10. Patient will need outpatient followup with PCP.  #3 chest pain On admission patient had some complaints of chest pain and shortness of breath which was attributed to anxiety. Cardiac enzymes which was cycled were negative x3. No no significant EKG changes. VQ  scan which was done at low probability for PE. Patient's chest pain resolved by day of discharge. Patient discharged in stable and improved condition.  Procedures:  Upper extremity Dopplers 10/08/2013  VQ scan per 11 2015  Consultations:  Hematology: Dr Alvy Bimler  10/08/13  Discharge Exam: Filed Vitals:   10/09/13 1024  BP: 108/56  Pulse: 68  Temp: 99 F (37.2 C)  Resp: 18    General: NAD Cardiovascular: RRR Respiratory: CTAB  Discharge Instructions      Discharge Orders   Future Appointments Provider Department Dept Phone   10/14/2013 4:30 PM Azalia Bilis, MD [place] [id number]   01/07/2014 9:00 AM [facility] [id number]   01/08/2014 8:15 AM [facility] [phone]   01/08/2014 8:45 AM Heath Lark, MD [facility] [phone]   Future Orders Complete By Expires   Diet general  As directed    Discharge instructions  As directed    Comments:     Follow up with SCHOENHOFF,DEBBIE, MD in 1 week. Get coumadin PT/INR checked tomorrow 10/10/13 and have results called into PCP office. Follow up with Dr Alvy Bimler in 3 months.   Increase activity slowly  As directed        Medication List         BIOTIN PO  Take 1 tablet by mouth daily.     enoxaparin 150 MG/ML injection  Commonly known as:  LOVENOX  Inject 0.87 mLs (130 mg total) into the skin daily. START TONIGHT AT 2200 HOURS X 5 DAYS     EYE ALLERGY RELIEF OP  Apply 1-2 drops to eye daily as needed (for allergies/irritation).     HYDROcodone-acetaminophen 7.5-325 MG per tablet  Commonly known as:  LORTAB  Take 1 tablet by mouth every 6 (six) hours as needed for moderate pain.     methylphenidate 54 MG CR tablet  Commonly known as:  CONCERTA  Take 54 mg by mouth every morning.     miconazole 2 % vaginal cream  Commonly known as:  MONISTAT 7  Place 1 Applicatorful vaginally at bedtime. Use for 7 days.     warfarin 5 MG tablet  Commonly known as:  COUMADIN  Take 2 tablets (10 mg total) by mouth one time only at 6 PM.  Start taking on:  10/10/2013       Allergies  Allergen Reactions  . Ivp Dye  [Iodinated Diagnostic Agents] Hives and Shortness Of Breath   Follow-up Information   Follow up with SCHOENHOFF,DEBBIE, MD. Schedule an appointment as soon as possible for a visit in 1 week.   Specialty:  Internal Medicine   Contact information:   [address] [place] [address] [phone]       Follow up On 10/10/2013. (PT/INR coumadin check tommorrow)       Follow up with [facility], NI, MD On 01/08/2014. (f/u at 815 am)    Specialty:  Hematology and Oncology   Contact information:   [place] [phone]        The results of significant diagnostics from this hospitalization (including imaging, microbiology, ancillary and laboratory) are listed below for reference.    Significant Diagnostic Studies: Dg Chest 2 View  10/08/2013   CLINICAL DATA Shortness of breath, chest pain.  EXAM CHEST  2 VIEW  COMPARISON 05/17/2004  FINDINGS Heart size and mediastinal  contours within normal range. No confluent airspace opacity, pleural effusion, or pneumothorax. Gentle right curvature of the lower thoracic/ upper lumbar spine. No acute osseous finding.  IMPRESSION No radiographic evidence of an acute cardiopulmonary process.  SIGNATURE  Electronically Signed   By: Carlos Levering M.D.   On: 10/08/2013 04:44   US Transvaginal Non-ob  09/27/2013   CLINICAL DATA:  Acute right-sided pain, evaluate for torsion  EXAM: TRANSVAGINAL ULTRASOUND OF PELVIS  DOPPLER ULTRASOUND OF OVARIES  TECHNIQUE: Transvaginal ultrasound examination of the pelvis was performed including evaluation of the uterus, ovaries, adnexal regions, and pelvic cul-de-sac.  Color and duplex Doppler ultrasound was utilized to evaluate blood flow to the ovaries.  COMPARISON:  [facility] pelvic ultrasound dated 09/23/2013  FINDINGS: Uterus  Measurements: 7.8 x 3.6 x 4.5 cm. No fibroids or other mass visualized.  Endometrium  Poorly visualized.  IUD in satisfactory position.   Right ovary  Measurements: 7.9 x 4.9 x 3.8 cm. Dominant 4.6 x 4.5 x 4.5 cm predominantly simple cyst but with possible 16 x 13 mm peripheral mural nodule (image 20). Additional 2.8 x 2.9 x 2.6 cm complex/septated cyst.  Left ovary  Measurements: 3.4 x 2.1 x 4.0 cm. Normal appearance/no adnexal mass.  Pulsed Doppler evaluation demonstrates normal low-resistance arterial and venous waveforms in both ovaries.  Additional comments: Trace pelvic fluid.  IMPRESSION: No evidence of ovarian torsion.  4.6 cm cystic right ovarian lesion with possible mural nodule. Additional 2.9 cm complex/septated right ovarian cyst. Despite the appearance, these findings are likely physiologic given the patient's age, and follow-up pelvic ultrasound is suggested in 6-10 weeks. If persistent, consider pelvic MRI with/without contrast for further evaluation.  IUD in satisfactory position.   Electronically Signed   By: Julian Hy M.D.   On: 09/27/2013 17:31   US Transvaginal Non-ob  09/23/2013   SEE PROGRESS NOTES FOR RESULTS   Nm Pulmonary Perf And Vent  10/08/2013   CLINICAL DATA Postop week 1, positive DVT. Shortness of breath and chest pain. Unable to have CT contrast due to allergy.  EXAM NUCLEAR MEDICINE VENTILATION - PERFUSION LUNG SCAN  TECHNIQUE Ventilation images were obtained in multiple projections using inhaled aerosol technetium 99 M DTPA. Perfusion images were obtained in multiple projections after intravenous injection of Tc-35m MAA.  RADIOPHARMACEUTICALS Forty mCi Tc-13m DTPA aerosol and 6 mCi Tc-24m MAA  COMPARISON Same day chest radiograph  FINDINGS Ventilation: No focal ventilation defect.  Perfusion: No wedge shaped peripheral perfusion defects to suggest acute pulmonary embolism.  IMPRESSION Low probability for pulmonary embolism.  SIGNATURE  Electronically Signed   By: Carlos Levering M.D.   On: 10/08/2013 04:21   US Venous Img Lower Unilateral Left  10/07/2013   CLINICAL DATA Factor 5 Leiden  deficiency.  Left calf pain.  EXAM Left LOWER EXTREMITY VENOUS DOPPLER ULTRASOUND  TECHNIQUE Gray-scale sonography with graded compression, as well as color Doppler and duplex ultrasound, were performed to evaluate the deep venous system from the level of the common femoral vein through the popliteal and proximal calf veins. Spectral Doppler was utilized to evaluate flow at rest and with distal augmentation maneuvers.  COMPARISON None.  FINDINGS Thrombus within deep veins: There is thrombus identified in the left peroneal vein. There is no other thrombus visualized.  Compressibility of deep veins: The left peroneal vein is noncompressible. The remainder of the deep venous system is compressible.  Duplex waveform respiratory phasicity:  Normal.  Duplex waveform response to augmentation:  Normal.  Venous reflux:  None visualized.  Other findings: There is no color flow identified within the left peroneal vein.  IMPRESSION 1. Occlusive thrombosis of the left peroneal vein.  SIGNATURE  Electronically Signed   By: Kathreen Devoid   On: 10/07/2013 13:53    Microbiology: Recent Results (from the past 240 hour(s))  URINE CULTURE     Status: None   Collection Time    10/06/13  1:03 PM      Result Value Ref Range Status   Colony Count NO GROWTH   Final   Organism ID, Bacteria NO GROWTH   Final     Labs: Basic Metabolic Panel:  Recent Labs Lab 10/07/13 1615 10/08/13 0645 10/09/13 0635  NA 142 140 138  K 4.2 3.5* 3.8  CL 103 104 103  CO2 25 25 22   GLUCOSE 98 92 94  BUN 12 12 11   CREATININE 0.70 0.80 0.59  CALCIUM 9.6 8.8 8.7   Liver Function Tests:  Recent Labs Lab 10/08/13 0645  AST 13  ALT 8  ALKPHOS 36*  BILITOT 0.3  PROT 6.3  ALBUMIN 3.2*   No results found for this basename: LIPASE, AMYLASE,  in the last 168 hours No results found for this basename: AMMONIA,  in the last 168 hours CBC:  Recent Labs Lab 10/07/13 1615 10/08/13 0645 10/09/13 0635  WBC 11.2* 8.3 8.8  NEUTROABS   --  3.5  --   HGB 14.3 12.6 12.9  HCT 41.4 36.2 37.2  MCV 87.9 86.8 86.7  PLT 336 273 279   Cardiac Enzymes:  Recent Labs Lab 10/07/13 2239 10/08/13 0645 10/08/13 1220  TROPONINI <0.30 <0.30 <0.30   BNP: BNP (last 3 results) No results found for this basename: PROBNP,  in the last 8760 hours CBG: No results found for this basename: GLUCAP,  in the last 168 hours     Signed:  [facility] MD [facility] 10/09/2013, 5:02 PM

## 2013-10-09 NOTE — Telephone Encounter (Signed)
s/w pt re doppler appt @ [facility] 6/10. also confirmed new pt appt for 6/11. per 3/11 pof doppler prior to visit int june.

## 2013-10-09 NOTE — Progress Notes (Signed)
Pt A&Ox4; pt discharge education and instructions completed with pt and partner in room. Pt denies any questions and voices understanding. Pt watched educational video on coumadin and provided booklet and printout information on Lovenox and coumadin. All lines including IV and telemetry removed from pt. Pt ambulated off unit with partner and belongings at side.

## 2013-10-09 NOTE — Telephone Encounter (Signed)
S/W PATIENT AND GAVE NEW PATIENT APPT FOR 03/17 @ 10:30 W/DR. CHISM.  [place] PACKET MAILED.

## 2013-10-09 NOTE — Progress Notes (Signed)
Pharmacy Note-Anticoagulation  Pharmacy Consult :  28 y.o. female is currently on Coumadin with Lovenox bridging for DVT following recent surgery and is heterozygous for factor V 5 Leiden mutation.  Latest Labs : Hematology :  Recent Labs  10/07/13 1615 10/07/13 2300 10/08/13 0645 10/09/13 0635  HGB 14.3  --  12.6 12.9  HCT 41.4  --  36.2 37.2  PLT 336  --  273 279  LABPROT  --  12.1 13.5 13.6  INR  --  0.91 1.05 1.06  CREATININE 0.70  --  0.80 0.59    Lab Results  Component Value Date   INR 1.06 10/09/2013   INR 1.05 10/08/2013   INR 0.91 10/07/2013        HGB 12.9 10/09/2013   HGB 12.6 10/08/2013   HGB 14.3 10/07/2013    Current Medication[s] Include: Medication PTA: Medication Sig  . BIOTIN PO Take 1 tablet by mouth daily.  Marland Kitchen HYDROcodone-acetaminophen (LORTAB) 7.5-325 MG per tablet Take 1 tablet by mouth every 6 (six) hours as needed for moderate pain.  . methylphenidate (CONCERTA) 54 MG CR tablet Take 54 mg by mouth every morning.  [name] (EYE ALLERGY RELIEF OP) Apply 1-2 drops to eye daily as needed (for allergies/irritation).    Scheduled:  Scheduled:  . enoxaparin (LOVENOX) injection  90 mg Subcutaneous Q12H  . methylphenidate  54 mg Oral Q breakfast  . senna  2 tablet Oral Daily  . sodium chloride  3 mL Intravenous Q12H  . warfarin  1 each Does not apply Once  . Warfarin - Pharmacist Dosing Inpatient   Does not apply q1800   Infusion[s]: Infusions:   Antibiotic[s]: Anti-infectives   Start     Dose/Rate Route Frequency Ordered Stop   10/08/13 0100  fluconazole (DIFLUCAN) tablet 150 mg     150 mg Oral  Once 10/08/13 0014 10/08/13 0741      Assessment :  Today's INR is 1.06, Subtherapeutic following 2 doses of Coumadin.      Lovenox Bridging to continue for minimum overlap with Coumadin of 5 days.  Patient is receiving treatment doses.  No bleeding complications observed.  Goal :  INR goal is 2-3    Lovenox Anti-Xa level 0.6-1  units/ml 4hrs after LMWH dose given   Plan : 1. Continue Lovenox 90 mg sq q 12 hours 2. Increase Coumadin to 10 mg x 1 today. 3. Daily INR's, CBC. Monitor for bleeding complications 4. Coumadin discharge education.  Demosthenes Virnig, Craig Guess, Pharm.D. 10/09/2013  10:44 AM

## 2013-10-09 NOTE — Progress Notes (Signed)
Pt provided booklet and print out information on coumadin and Lovenox. Pt watched video on coumadin. Pt states understanding after watching the video. Pt allowed to self-administer her am Lovenox dose. Pt self administer medication with no problem noted. Education very successful.

## 2013-10-09 NOTE — Telephone Encounter (Signed)
S/W PATIENT AND GAVE HOSP F/U APPT FOR 06/11 @ 8:15 W/DR. [place] PACKET MAILED.

## 2013-10-10 ENCOUNTER — Telehealth: Payer: Self-pay | Admitting: *Deleted

## 2013-10-10 ENCOUNTER — Telehealth: Payer: Self-pay | Admitting: Gynecology

## 2013-10-10 ENCOUNTER — Other Ambulatory Visit: Payer: Self-pay | Admitting: Internal Medicine

## 2013-10-10 DIAGNOSIS — Z801 Family history of malignant neoplasm of trachea, bronchus and lung: Secondary | ICD-10-CM

## 2013-10-10 DIAGNOSIS — Z803 Family history of malignant neoplasm of breast: Secondary | ICD-10-CM

## 2013-10-10 DIAGNOSIS — B009 Herpesviral infection, unspecified: Secondary | ICD-10-CM | POA: Diagnosis present

## 2013-10-10 DIAGNOSIS — I82409 Acute embolism and thrombosis of unspecified deep veins of unspecified lower extremity: Secondary | ICD-10-CM | POA: Diagnosis present

## 2013-10-10 DIAGNOSIS — F341 Dysthymic disorder: Secondary | ICD-10-CM | POA: Diagnosis present

## 2013-10-10 DIAGNOSIS — Z800 Family history of malignant neoplasm of digestive organs: Secondary | ICD-10-CM

## 2013-10-10 DIAGNOSIS — D6859 Other primary thrombophilia: Secondary | ICD-10-CM | POA: Diagnosis present

## 2013-10-10 DIAGNOSIS — D72829 Elevated white blood cell count, unspecified: Secondary | ICD-10-CM | POA: Diagnosis present

## 2013-10-10 DIAGNOSIS — R0789 Other chest pain: Secondary | ICD-10-CM | POA: Diagnosis present

## 2013-10-10 DIAGNOSIS — M25569 Pain in unspecified knee: Principal | ICD-10-CM | POA: Diagnosis present

## 2013-10-10 DIAGNOSIS — Z8249 Family history of ischemic heart disease and other diseases of the circulatory system: Secondary | ICD-10-CM

## 2013-10-10 DIAGNOSIS — Z7901 Long term (current) use of anticoagulants: Secondary | ICD-10-CM

## 2013-10-10 DIAGNOSIS — Z86718 Personal history of other venous thrombosis and embolism: Secondary | ICD-10-CM

## 2013-10-10 DIAGNOSIS — Z833 Family history of diabetes mellitus: Secondary | ICD-10-CM

## 2013-10-10 DIAGNOSIS — Z888 Allergy status to other drugs, medicaments and biological substances status: Secondary | ICD-10-CM

## 2013-10-10 NOTE — Telephone Encounter (Signed)
Case Manager from [facility] called to report pt being d/c'd home on Coumadin and Hospitalist asking if Dr. Alvy Bimler will manage/monitor coumadin?

## 2013-10-10 NOTE — Telephone Encounter (Signed)
Called pt to inform pt that Hospitalist asked Dr. Alvy Bimler to monitor pt's coumadin.  Pt says she had INR done today by [facility] lab and result to be sent to her PCP.  She has appt Monday morning with her PCP. She is not sure if her PCP is going to keep monitoring her coumadin or if they want Dr. Alvy Bimler to monitor.  She will discuss with her PCP on Monday.   Instructed pt to let us know and we can get her scheduled with our coumadin clinic if needed.  Meanwhile,  She has not heard about her INR result today.  Instructed her to continue same dose coumadin and lovenox she was d/c'd home on if she does not hear anything today.  If her result is too high,  The lab will have to notify her PCP or covering physician.  INR was still sub therapeutic yesterday in the hospital.   Pt verbalized understanding and will call on Monday after she sees her PCP.

## 2013-10-10 NOTE — Telephone Encounter (Signed)
Case Manager states that she is unable to get pt into hematology before DC  and wanted to know if Dr Coralyn Mark would manage her coumadin. Will notify Dr Coralyn Mark. And will arrange a follow up appt with Dr Coralyn Mark.

## 2013-10-10 NOTE — Care Management Note (Signed)
    Page 1 of 2   10/10/2013     2:19:07 PM   CARE MANAGEMENT NOTE 10/10/2013  Patient:  Julie Doyle, Julie Doyle   Account Number:  [id number]  Date Initiated:  10/10/2013  Documentation initiated by:  Lorne Skeens  Subjective/Objective Assessment:   Patient admitted for lovenox Coumadin bridge, hx: factor V clotting disorder     Action/Plan:   Will follow for discharge needs.   Anticipated DC Date:     Anticipated DC Plan:  HOME/SELF CARE      DC Planning Services  CM consult      Choice offered to / List presented to:             Status of service:   Medicare Important Message given?   (If response is "NO", the following Medicare IM given date fields will be blank) Date Medicare IM given:   Date Additional Medicare IM given:    Discharge Disposition:  HOME/SELF CARE  Per UR Regulation:  Reviewed for med. necessity/level of care/duration of stay  If discussed at [place] of Stay Meetings, dates discussed:    Comments:  10/10/13 [place], MSN, CM- No return call from Dr Calton Dach office, CM spoke with Tammi Klippel at Dr Sharin Mons office (PCP), who states that they will follow patient's Coumadin dosing until taken over by hematology. CM made them aware to expect lab results today.  Tammi Klippel states she will be calling patient immediately to set up follow-up appointment and discuss lab monitoring.  CM made patient aware.   10/10/13 [place], MSN, CM- Message left with Dr Calton Dach nurse line regarding patient follow-up, awaiting return call.   10/09/13 [place], MSN, CM- Met with patient to discuss PT/INR lab draws and follow-up for Coumadin dosing.  Patient will be discharging home this evening with a hard script for lab draws, results to be sent to PCP.  CM will follow-up tomorrow morning with PCP and Dr Calton Dach office to determine who will be following patient's Coumadin dosing.

## 2013-10-10 NOTE — Telephone Encounter (Signed)
On Call Note :  Patient calls with history of Laparoscopic right oophorectomy 09/28/2013.  Post Operative course complicated by left DVT 5/00/9381.  Was hospitalized and currently being anticoagulated on Lovenox and Coumadin. Hx of Leiden 5 mutation, heterozygous.  Now notes some pain in right leg.  No swelling or respiratory symptoms.  Wanted to know if DVT possible in right leg.  Reviewed that it is possible even during anticoagulation and that she really should direct her questions and management recommendation to the physician managing her anticoagulation.  She agrees to call them tonight.  If any difficulty getting through or certainly pain persists/worsens or respiratory symptoms recommended ER evaluation ASAP.

## 2013-10-10 NOTE — Telephone Encounter (Signed)
Dr Filiberto Pinks' office notified regarding follow up coumadin management. An appt was made for F/U in 3 months, with thought that PCP will manage her treatment. Left message with Dr Filiberto Pinks' RN to return call. F/U appt made for Monday 03/16to see Dr Coralyn Mark, and discuss hemotology referral. Pt states that she plans to relocate to [facility] in 2 weeks and it would be easier  for her to be referred to someone in that area.

## 2013-10-11 ENCOUNTER — Encounter (HOSPITAL_COMMUNITY): Payer: Self-pay | Admitting: Emergency Medicine

## 2013-10-11 ENCOUNTER — Inpatient Hospital Stay (HOSPITAL_COMMUNITY)
Admission: EM | Admit: 2013-10-11 | Discharge: 2013-10-14 | DRG: 556 | Disposition: A | Discharge status: Home / Self Care | Payer: BC Managed Care – PPO | Attending: Internal Medicine | Admitting: Internal Medicine

## 2013-10-11 DIAGNOSIS — M79609 Pain in unspecified limb: Secondary | ICD-10-CM

## 2013-10-11 DIAGNOSIS — D72829 Elevated white blood cell count, unspecified: Secondary | ICD-10-CM

## 2013-10-11 DIAGNOSIS — I82409 Acute embolism and thrombosis of unspecified deep veins of unspecified lower extremity: Secondary | ICD-10-CM | POA: Diagnosis present

## 2013-10-11 DIAGNOSIS — M79604 Pain in right leg: Secondary | ICD-10-CM

## 2013-10-11 DIAGNOSIS — F411 Generalized anxiety disorder: Secondary | ICD-10-CM | POA: Diagnosis present

## 2013-10-11 DIAGNOSIS — D6851 Activated protein C resistance: Secondary | ICD-10-CM | POA: Diagnosis present

## 2013-10-11 DIAGNOSIS — R0789 Other chest pain: Secondary | ICD-10-CM | POA: Diagnosis present

## 2013-10-11 DIAGNOSIS — F329 Major depressive disorder, single episode, unspecified: Secondary | ICD-10-CM | POA: Diagnosis present

## 2013-10-11 LAB — BASIC METABOLIC PANEL
BUN: 10 mg/dL (ref 6–23)
CO2: 27 mEq/L (ref 19–32)
Calcium: 9.8 mg/dL (ref 8.4–10.5)
Chloride: 99 mEq/L (ref 96–112)
Creatinine, Ser: 0.63 mg/dL (ref 0.50–1.10)
GFR calc Af Amer: >90 mL/min (ref >90)
GLUCOSE: 78 mg/dL (ref 70–99)
POTASSIUM: 4.6 meq/L (ref 3.7–5.3)
Sodium: 140 mEq/L (ref 137–147)

## 2013-10-11 LAB — CBC WITH DIFFERENTIAL/PLATELET
BASOS ABS: 0.1 10*3/uL (ref 0.0–0.1)
Basophils Relative: 0 % (ref 0–1)
Eosinophils Absolute: 0.3 10*3/uL (ref 0.0–0.7)
Eosinophils Relative: 3 % (ref 0–5)
HCT: 41 % (ref 36.0–46.0)
Hemoglobin: 14.7 g/dL (ref 12.0–15.0)
LYMPHS ABS: 4 10*3/uL (ref 0.7–4.0)
LYMPHS PCT: 34 % (ref 12–46)
MCH: 30.6 pg (ref 26.0–34.0)
MCHC: 35.9 g/dL (ref 30.0–36.0)
MCV: 85.2 fL (ref 78.0–100.0)
Monocytes Absolute: 0.8 10*3/uL (ref 0.1–1.0)
Monocytes Relative: 7 % (ref 3–12)
Neutro Abs: 6.8 10*3/uL (ref 1.7–7.7)
Neutrophils Relative %: 57 % (ref 43–77)
PLATELETS: 310 10*3/uL (ref 150–400)
RBC: 4.81 MIL/uL (ref 3.87–5.11)
RDW: 12.4 % (ref 11.5–15.5)
WBC: 11.9 10*3/uL — AB (ref 4.0–10.5)

## 2013-10-11 LAB — URINALYSIS, ROUTINE W REFLEX MICROSCOPIC
BILIRUBIN URINE: NEGATIVE
GLUCOSE, UA: NEGATIVE mg/dL
Hgb urine dipstick: NEGATIVE
KETONES UR: NEGATIVE mg/dL
LEUKOCYTES UA: NEGATIVE
Nitrite: NEGATIVE
PROTEIN: NEGATIVE mg/dL
Specific Gravity, Urine: 1.01 (ref 1.005–1.030)
Urobilinogen, UA: 0.2 mg/dL (ref 0.0–1.0)
pH: 7 (ref 5.0–8.0)

## 2013-10-11 LAB — PROTIME-INR
INR: 1.24 (ref <1.50)
INR: 1.43 (ref 0.00–1.49)
PROTHROMBIN TIME: 15.4 s — AB (ref 11.6–15.2)
PROTHROMBIN TIME: 17.1 s — AB (ref 11.6–15.2)

## 2013-10-11 LAB — I-STAT TROPONIN, ED: TROPONIN I, POC: 0 ng/mL (ref 0.00–0.08)

## 2013-10-11 LAB — MAGNESIUM: MAGNESIUM: 2 mg/dL (ref 1.5–2.5)

## 2013-10-11 MED ORDER — HYDROCODONE-ACETAMINOPHEN 7.5-325 MG PO TABS: 1.0000 | ORAL_TABLET | Freq: Four times a day (QID) | ORAL | Status: DC | PRN

## 2013-10-11 MED ORDER — SODIUM CHLORIDE 0.9 % IV SOLN: 250.0000 mL | INTRAVENOUS | Status: DC | PRN

## 2013-10-11 MED ORDER — SODIUM CHLORIDE 0.9 % IJ SOLN
3.0000 mL | Freq: Two times a day (BID) | INTRAMUSCULAR | Status: DC
  Administered 2013-10-11 – 2013-10-14 (×5): 3 mL via INTRAVENOUS

## 2013-10-11 MED ORDER — ALUM & MAG HYDROXIDE-SIMETH 200-200-20 MG/5ML PO SUSP: 30.0000 mL | Freq: Four times a day (QID) | ORAL | Status: DC | PRN

## 2013-10-11 MED ORDER — POLYETHYLENE GLYCOL 3350 17 G PO PACK: 17.0000 g | PACK | Freq: Every day | ORAL | Status: DC | PRN

## 2013-10-11 MED ORDER — PROCHLORPERAZINE EDISYLATE 5 MG/ML IJ SOLN
10.0000 mg | Freq: Once | INTRAMUSCULAR | Status: DC
  Filled 2013-10-11: qty 2

## 2013-10-11 MED ORDER — NAPHAZOLINE-PHENIRAMINE 0.025-0.3 % OP SOLN
2.0000 [drp] | Freq: Four times a day (QID) | OPHTHALMIC | Status: DC | PRN
  Filled 2013-10-11: qty 15

## 2013-10-11 MED ORDER — SORBITOL 70 % SOLN: 30.0000 mL | Freq: Every day | Status: DC | PRN

## 2013-10-11 MED ORDER — WARFARIN SODIUM 10 MG PO TABS
10.0000 mg | ORAL_TABLET | Freq: Once | ORAL | Status: DC
  Filled 2013-10-11: qty 1

## 2013-10-11 MED ORDER — ENOXAPARIN SODIUM 150 MG/ML ~~LOC~~ SOLN
90.0000 mg | Freq: Two times a day (BID) | SUBCUTANEOUS | Status: DC
  Administered 2013-10-11: 90 mg via SUBCUTANEOUS
  Filled 2013-10-11 (×3): qty 1

## 2013-10-11 MED ORDER — ENOXAPARIN SODIUM 150 MG/ML ~~LOC~~ SOLN
130.0000 mg | SUBCUTANEOUS | Status: DC
  Filled 2013-10-11 (×2): qty 1

## 2013-10-11 MED ORDER — LORAZEPAM 1 MG PO TABS
1.0000 mg | ORAL_TABLET | Freq: Once | ORAL | Status: AC
  Administered 2013-10-11: 1 mg via ORAL
  Filled 2013-10-11: qty 2

## 2013-10-11 MED ORDER — WARFARIN - PHARMACIST DOSING INPATIENT: Freq: Every day | Status: DC

## 2013-10-11 MED ORDER — ONDANSETRON HCL 4 MG/2ML IJ SOLN: 4.0000 mg | Freq: Four times a day (QID) | INTRAMUSCULAR | Status: DC | PRN

## 2013-10-11 MED ORDER — ONDANSETRON HCL 4 MG PO TABS: 4.0000 mg | ORAL_TABLET | Freq: Four times a day (QID) | ORAL | Status: DC | PRN

## 2013-10-11 MED ORDER — METHYLPHENIDATE HCL ER (OSM) 18 MG PO TBCR
54.0000 mg | EXTENDED_RELEASE_TABLET | ORAL | Status: DC
  Administered 2013-10-13: 36 mg via ORAL
  Administered 2013-10-14: 54 mg via ORAL
  Filled 2013-10-11 (×4): qty 3

## 2013-10-11 MED ORDER — LORAZEPAM 1 MG PO TABS
1.0000 mg | ORAL_TABLET | Freq: Three times a day (TID) | ORAL | Status: DC | PRN
  Administered 2013-10-11 – 2013-10-14 (×6): 1 mg via ORAL
  Filled 2013-10-11 (×6): qty 1

## 2013-10-11 MED ORDER — SODIUM CHLORIDE 0.9 % IJ SOLN
3.0000 mL | INTRAMUSCULAR | Status: DC | PRN
  Administered 2013-10-12: 3 mL via INTRAVENOUS

## 2013-10-11 MED ORDER — DOCUSATE SODIUM 100 MG PO CAPS
100.0000 mg | ORAL_CAPSULE | Freq: Two times a day (BID) | ORAL | Status: DC
Start: 2013-10-11 — End: 2013-10-12
  Administered 2013-10-11 – 2013-10-12 (×3): 100 mg via ORAL
  Filled 2013-10-11 (×2): qty 1

## 2013-10-11 MED ORDER — WARFARIN SODIUM 10 MG PO TABS
10.0000 mg | ORAL_TABLET | Freq: Every day | ORAL | Status: DC
  Administered 2013-10-11: 10 mg via ORAL
  Filled 2013-10-11 (×2): qty 1

## 2013-10-11 MED ORDER — ALBUTEROL SULFATE (2.5 MG/3ML) 0.083% IN NEBU: 2.5000 mg | INHALATION_SOLUTION | RESPIRATORY_TRACT | Status: DC | PRN

## 2013-10-11 MED ORDER — MAGNESIUM CITRATE PO SOLN: 1.0000 | Freq: Once | ORAL | Status: AC | PRN

## 2013-10-11 MED ORDER — ACETAMINOPHEN 325 MG PO TABS
650.0000 mg | ORAL_TABLET | Freq: Four times a day (QID) | ORAL | Status: DC | PRN
  Administered 2013-10-12: 650 mg via ORAL
  Filled 2013-10-11: qty 2

## 2013-10-11 MED ORDER — WARFARIN - PHYSICIAN DOSING INPATIENT: Freq: Every day | Status: DC

## 2013-10-11 MED ORDER — OXYCODONE HCL 5 MG PO TABS
5.0000 mg | ORAL_TABLET | ORAL | Status: DC | PRN
  Administered 2013-10-11 – 2013-10-12 (×4): 5 mg via ORAL
  Filled 2013-10-11 (×4): qty 1

## 2013-10-11 MED ORDER — ACETAMINOPHEN 650 MG RE SUPP: 650.0000 mg | Freq: Four times a day (QID) | RECTAL | Status: DC | PRN

## 2013-10-11 MED ORDER — OXYCODONE-ACETAMINOPHEN 5-325 MG PO TABS
1.0000 | ORAL_TABLET | ORAL | Status: AC
  Administered 2013-10-11: 1 via ORAL
  Filled 2013-10-11: qty 1

## 2013-10-11 NOTE — ED Notes (Signed)
Decision to admit pt.  Pt is resting, will not wake her at this time.

## 2013-10-11 NOTE — ED Notes (Signed)
Rec'd pt in room 29. Pt reports she is going to "just chill out and see if this will pass" referring to chest pain/tightness.  Reports having VQ scan 2 days ago and is not really wanting to have another.  Pt alert, appropriate.  Bilat leg pain rated 6/10.  resp regular and easy, no distress noted.

## 2013-10-11 NOTE — Progress Notes (Signed)
VASCULAR LAB PRELIMINARY  PRELIMINARY  PRELIMINARY  PRELIMINARY  Bilateral lower extremity venous Dopplers completed.    Preliminary report:  DVT found in the peroneal vein 10/07/13, remains.  All other veins appear thrombus free, bilaterally.  Bryla Burek, RVT 10/11/2013, 9:19 AM

## 2013-10-11 NOTE — ED Provider Notes (Signed)
MSE was initiated and I personally evaluated the patient and placed orders (if any) at  12:42 AM on October 11, 2013.  The patient appears stable so that the remainder of the MSE may be completed by another provider.  Patient presents emergency department with chief complaint of right leg pain. She was recently admitted for a left leg pain and DVT. She states that her symptoms are now starting in the right leg. She is afraid that she has got a new DVT in the right leg. She also states that she has had some chest tightness in the past day. She is uncertain whether this is anxiety, or something else. She is also concerned about PE. She has factor V Leiden disorder, and has undergone recent surgery. She is currently taking Lovenox and warfarin.  On physical exam, she is not in a respiratory distress, nor she have hypoxia. She does have tenderness to palpation of bilateral lower extremities. I briefly discussed patient with Dr. Lita Mains, who recommends CT angio and lab work.  Patient will be moved from the fast track area to the acute side.  Montine Circle, PA-C 10/11/13 0045

## 2013-10-11 NOTE — Progress Notes (Signed)
ANTICOAGULATION CONSULT NOTE - Initial Consult  Pharmacy Consult for Lovenox and Coumadin Indication: DVT  Allergies  Allergen Reactions  . Ivp Dye [Iodinated Diagnostic Agents] Hives and Shortness Of Breath    Patient Measurements:  Weight: 86kg  Vital Signs: Temp: 99.5 F (37.5 C) (03/14 0119) Temp src: Oral (03/14 0119) BP: 99/60 mmHg (03/14 0645) Pulse Rate: 71 (03/14 0645)  Labs:  Recent Labs  10/08/13 1220 10/09/13 0635 10/10/13 1639 10/11/13 0110  HGB  --  12.9  --  14.7  HCT  --  37.2  --  41.0  PLT  --  279  --  310  LABPROT  --  13.6 15.4* 17.1*  INR  --  1.06 1.24 1.43  CREATININE  --  0.59  --  0.63  TROPONINI <0.30  --   --   --     The CrCl is unknown because both a height and weight (above a minimum accepted value) are required for this calculation.   Medical History: Past Medical History  Diagnosis Date  . Factor 5 Leiden mutation, heterozygous   . Anxiety   . Clotting disorder   . Dysmenorrhea   . HSV-1 infection   . Blood dyscrasia     factor V  . Ovarian cyst     Medications:   (Not in a hospital admission)  Assessment: 27yof with hx Factor V Leiden deficiency discharged on 3/12 with acute DVT receiving treatment with Lovenox bridging to Coumadin. Patient now readmitted with continued leg pain. Pharmacy has been consulted to continue Lovenox and Coumadin bridge for acute DVT. Patient was discharged on Lovenox 130mg  daily (was actually using the entire 150mg  syringe - last dose 3/13 @ 2130) and Coumadin 10mg  daily (last dose 3/13). Admit INR was subtherapeutic at 1.43 but has begun trending up based on previous INR of 1.24 (drawn as outpatient). Will plan to convert Lovenox back to BID dosing and continue Coumadin 10mg  daily. - H/H and Plts wnl - No significant bleeding reported - LFTs wnl - Dopplers repeated today - no new DVT found  Goal of Therapy:  INR 2-3 Anti-Xa level 0.6-1 units/ml 4hrs after LMWH dose given Monitor platelets  by anticoagulation protocol: Yes   Plan:  1. Change Lovenox to 90mg  (~1mg /kg) SQ q12h - next dose due @ 2200 tonight 2. Continue Coumadin 10mg  PO daily 3. Daily INR, CBC q72h  Earleen Newport [phone] 10/11/2013,9:22 AM

## 2013-10-11 NOTE — ED Notes (Signed)
hospitalist at bedside to eval pt

## 2013-10-11 NOTE — ED Notes (Signed)
Pt. reports right leg pain onset last night , denies injury or fall , ambulatory.

## 2013-10-11 NOTE — H&P (Signed)
[facility] History and Physical  Julie Doyle [id number] DOB: 02/22/1986 DOA: 10/11/2013  Referring physician: Hazel Sams, PA PCP: Kelton Pillar, MD   Chief Complaint: RLE pain  HPI: Julie Doyle is a 28 y.o. female  With hx of newly diagnosed LLE DVT, recent laparoscopic resection of R ovarian cyst 09/28/13 per Dr Charlies Constable, history of factor V Leiden heterozygous mutation however only a carrier, history of implantable IUD who was recently admitted 3/10 to 10/09/2013 for newly diagnosed left lower extremity DVT affecting the peroneal nerve. Ultrasound which was done was done over the left lower extremity only. VQ scan which was done showed a low probability for PE. Patient was discharged home on full dose Lovenox and Coumadin which she states she was compliant with. Patient presents back to the ED with complaints of right behind the knee pain with radiation to the upper thigh. Patient unable to directly stated as to whether we have some associated edema. Patient also with some complaints of chest tightness and some shortness of breath which she attributes to stress and recent family issues which are ongoing. Patient was given a milligram of Ativan with improvement in the chest tightness and shortness of breath and feels close to baseline. Patient denies any presyncopal episodes, no edema, no hemoptysis, no palpitations. Patient denies any smoking history. Patient denies any fevers, no chills, no nausea, no vomiting, no abdominal pain, no diarrhea, no constipation, no dysuria. No cough. Patient does endorse some generalized weakness. Patient was seen in the ED bilateral lower extremity Dopplers are pending. We will consulted to admit the patient for further evaluation and management. INR noted in the ED was 1.43. Basic metabolic profile is unremarkable. CBC had a white count of 11.9 otherwise was within normal limits.   Review of Systems: As per history of present illness  otherwise negative. Constitutional:  No weight loss, night sweats, Fevers, chills, fatigue.  HEENT:  No headaches, Difficulty swallowing,Tooth/dental problems,Sore throat,  No sneezing, itching, ear ache, nasal congestion, post nasal drip,  Cardio-vascular:  No chest pain, Orthopnea, PND, swelling in lower extremities, anasarca, dizziness, palpitations  GI:  No heartburn, indigestion, abdominal pain, nausea, vomiting, diarrhea, change in bowel habits, loss of appetite  Resp:  No shortness of breath with exertion or at rest. No excess mucus, no productive cough, No non-productive cough, No coughing up of blood.No change in color of mucus.No wheezing.No chest wall deformity  Skin:  no rash or lesions.  GU:  no dysuria, change in color of urine, no urgency or frequency. No flank pain.  Musculoskeletal:  No joint pain or swelling. No decreased range of motion. No back pain.  Psych:  No change in mood or affect. No depression or anxiety. No memory loss.   Past Medical History  Diagnosis Date  . Factor 5 Leiden mutation, heterozygous   . Anxiety   . Clotting disorder   . Dysmenorrhea   . HSV-1 infection   . Blood dyscrasia     factor V  . Ovarian cyst    Past Surgical History  Procedure Laterality Date  . Orif of right fib  2011    Open Reduction of the right fibula  . Appendectomy  1998  . Wisdom tooth extraction  2012  . Cystectomy Right 01/07/2013    Laparoscopic  . Laparoscopy Right 01/07/2013    Procedure: LAPAROSCOPY OPERATIVE;  Surgeon: Azalia Bilis, MD;  Location: [facility];  Service: Gynecology;  Laterality: Right;  ovarian cystectomy  . Intrauterine device (  iud) insertion N/A 01/07/2013    Procedure: INTRAUTERINE DEVICE (IUD) INSERTION;  Surgeon: Azalia Bilis, MD;  Location: [facility];  Service: Gynecology;  Laterality: N/A;  . Laparoscopic ovarian cystectomy Right 09/28/2013    Procedure: LAPAROSCOPIC RIGHT OOPHORECTOMY;  Surgeon: Azalia Bilis, MD;  Location: [facility];   Service: Gynecology;  Laterality: Right;   Social History:  reports that she has never smoked. She has never used smokeless tobacco. She reports that she drinks alcohol. She reports that she does not use illicit drugs.  Allergies  Allergen Reactions  . Ivp Dye [Iodinated Diagnostic Agents] Hives and Shortness Of Breath    Family History  Problem Relation Age of Onset  . Hypertension Mother   . Clotting disorder Mother     DVT  . Cancer Maternal Grandmother     pancreatic  . Diabetes Maternal Grandmother   . Pancreatic cancer Maternal Grandmother   . Cancer Paternal Grandmother   . Breast cancer Paternal Grandmother   . Lung cancer Paternal Grandfather      Prior to Admission medications   Medication Sig Start Date End Date Taking? Authorizing Provider  BIOTIN PO Take 1 tablet by mouth daily.   Yes Historical Provider, MD  enoxaparin (LOVENOX) 150 MG/ML injection Inject 0.87 mLs (130 mg total) into the skin daily. START TONIGHT AT 2200 HOURS X 5 DAYS 10/09/13  Yes Eugenie Filler, MD  HYDROcodone-acetaminophen (LORTAB) 7.5-325 MG per tablet Take 1 tablet by mouth every 6 (six) hours as needed for moderate pain. 10/09/13  Yes Eugenie Filler, MD  methylphenidate (CONCERTA) 54 MG CR tablet Take 54 mg by mouth every morning.   Yes Historical Provider, MD  miconazole (MONISTAT 7) 2 % vaginal cream Place 1 Applicatorful vaginally at bedtime. Use for 7 days. 10/09/13  Yes Eugenie Filler, MD  Naphazoline-Pheniramine (EYE ALLERGY RELIEF OP) Apply 1-2 drops to eye daily as needed (for allergies/irritation).    Yes Historical Provider, MD  warfarin (COUMADIN) 5 MG tablet Take 2 tablets (10 mg total) by mouth one time only at 6 PM. 10/10/13  Yes Eugenie Filler, MD   Physical Exam: Filed Vitals:   10/11/13 0800  BP: 104/65  Pulse: 82  Temp:   Resp:     BP 104/65  Pulse 82  Temp(Src) 99.5 F (37.5 C) (Oral)  Resp 18  SpO2 98%  LMP 09/19/2013  General:  Appears calm and  comfortable, in no acute cardiopulmonary distress. Eyes: PERRLA, EOMI, normal lids, irises & conjunctiva ENT: grossly normal hearing, lips & tongue Neck: no LAD, masses or thyromegaly Cardiovascular: RRR, no m/r/g. No LE edema. Telemetry: SR, no arrhythmias  Respiratory: CTA bilaterally, no w/r/r. Normal respiratory effort. Abdomen: soft, nd, positive bowel sounds, mild abdominal discomfort. Skin: no rash or induration seen on limited exam Musculoskeletal: grossly normal tone BUE/BLE. Patient with compression stockings on. Left lower extremity nontender to palpation and decreased swelling. Right popliteal region tender to palpation. Positive Homans sign on the right lower extremity. Psychiatric: grossly normal mood and affect, speech fluent and appropriate Neurologic: Alert and oriented x3. Cranial nerves II through XII are grossly intact. No focal deficits.           Labs on Admission:  Basic Metabolic Panel:  Recent Labs Lab 10/07/13 1615 10/08/13 0645 10/09/13 0635 10/11/13 0110  NA 142 140 138 140  K 4.2 3.5* 3.8 4.6  CL 103 104 103 99  CO2 25 25 22 27   GLUCOSE 98 92 94  78  BUN 12 12 11 10   CREATININE 0.70 0.80 0.59 0.63  CALCIUM 9.6 8.8 8.7 9.8   Liver Function Tests:  Recent Labs Lab 10/08/13 0645  AST 13  ALT 8  ALKPHOS 36*  BILITOT 0.3  PROT 6.3  ALBUMIN 3.2*   No results found for this basename: LIPASE, AMYLASE,  in the last 168 hours No results found for this basename: AMMONIA,  in the last 168 hours CBC:  Recent Labs Lab 10/07/13 1615 10/08/13 0645 10/09/13 0635 10/11/13 0110  WBC 11.2* 8.3 8.8 11.9*  NEUTROABS  --  3.5  --  6.8  HGB 14.3 12.6 12.9 14.7  HCT 41.4 36.2 37.2 41.0  MCV 87.9 86.8 86.7 85.2  PLT 336 273 279 310   Cardiac Enzymes:  Recent Labs Lab 10/07/13 2239 10/08/13 0645 10/08/13 1220  TROPONINI <0.30 <0.30 <0.30    BNP (last 3 results) No results found for this basename: PROBNP,  in the last 8760 hours CBG: No  results found for this basename: GLUCAP,  in the last 168 hours  Radiological Exams on Admission: No results found.  EKG: Independently reviewed. Normal sinus rhythm  Assessment/Plan Principal Problem:   Right leg pain Active Problems:   DVT (deep venous thrombosis)   Factor 5 Leiden mutation, heterozygous   Anxiety state, unspecified   Reactive depression (situational)   Leukocytosis, unspecified   Chest tightness  #1 right behind the knee lower extremity pain Questionable etiology. Could be secondary to a DVT. Dopplers of the lower extremity which was done prior to her last hospitalization was only done of the left lower extremity. Patient with positive Homans sign. Patient's INR is subtherapeutic at 1.43. Patient however states she's been compliant with her Lovenox. Bilateral lower extremity Dopplers are pending. Continue full dose Lovenox. Continue Coumadin. Goal INR 2-3. Pain management. Supportive care.  #2 left lower extremity DVT Continue full dose Lovenox. INR is subtherapeutic at 1.43. Coumadin per pharmacy. Continue elastic compression stockings to reduce risk of chronic thrombophlebitis.  #3 chest tightness Likely secondary to anxiety as had improved on Ativan. Point-of-care troponin was negative. EKG with normal sinus rhythm. Patient currently chest pain-free. Patient had a recent VQ scan done with low probability for PE. Patient does not want to have another VQ scan done at this time and he was in my be stress related. Will hold off on the VQ scan at this time as if indeed she did have a PE management will be unchanged. Monitor for now.  #4 leukocytosis Likely reactive leukocytosis. Will check a UA with cultures and sensitivities. Patient with no upper respiratory symptoms. Monitor for now.  #5 anxiety/reactive depression Stable. Ativan as needed.  #6 factor V Leiden mutation heterozygous Patient will followup with hematology as outpatient.  #7 prophylaxis On full  dose Lovenox for DVT prophylaxis.  Code Status: Full Family Communication: Updated patient no family at bedside. Disposition Plan: Admit to MedSurg bed.  Time spent: 55 mins  [facility] Pager [phone]

## 2013-10-11 NOTE — ED Provider Notes (Signed)
CSN: [id number]     Arrival date & time 10/10/13  2357 History   First MD Initiated Contact with Patient 10/11/13 0008     Chief Complaint  Patient presents with  . Leg Pain   HPI  History provided by the patient. Patient is a 28 year old female with history of factor V Leiden mutation, ovarian cysts with recent right ovarian cystectomy and diagnosis of left lower extremity DVT who presents with concerns for pain and swelling now to her right lower leg. Patient was just released from the hospital after developing a DVT in the left lower leg following her recent surgeries. She was started on Lovenox and Coumadin which she has been taking as instructed. Yesterday she began having pain and swelling in her right lower leg as well and was concerned of additional clot. She did also describes some symptoms of chest discomforts but stated to me that this felt related to her increased stress and anxiety. She is denying any chest pain at this time. Denies shortness of breath. No lightheadedness or near syncope. Denies cough or hemoptysis. Denies any other aggravating or alleviating factors. No other associated symptoms.    Past Medical History  Diagnosis Date  . Factor 5 Leiden mutation, heterozygous   . Anxiety   . Clotting disorder   . Dysmenorrhea   . HSV-1 infection   . Blood dyscrasia     factor V  . Ovarian cyst    Past Surgical History  Procedure Laterality Date  . Orif of right fib  2011    Open Reduction of the right fibula  . Appendectomy  1998  . Wisdom tooth extraction  2012  . Cystectomy Right 01/07/2013    Laparoscopic  . Laparoscopy Right 01/07/2013    Procedure: LAPAROSCOPY OPERATIVE;  Surgeon: Azalia Bilis, MD;  Location: [facility];  Service: Gynecology;  Laterality: Right;  ovarian cystectomy  . Intrauterine device (iud) insertion N/A 01/07/2013    Procedure: INTRAUTERINE DEVICE (IUD) INSERTION;  Surgeon: Azalia Bilis, MD;  Location: [facility];  Service: Gynecology;   Laterality: N/A;  . Laparoscopic ovarian cystectomy Right 09/28/2013    Procedure: LAPAROSCOPIC RIGHT OOPHORECTOMY;  Surgeon: Azalia Bilis, MD;  Location: [facility];  Service: Gynecology;  Laterality: Right;   Family History  Problem Relation Age of Onset  . Hypertension Mother   . Clotting disorder Mother     DVT  . Cancer Maternal Grandmother     pancreatic  . Diabetes Maternal Grandmother   . Pancreatic cancer Maternal Grandmother   . Cancer Paternal Grandmother   . Breast cancer Paternal Grandmother   . Lung cancer Paternal Grandfather    History  Substance Use Topics  . Smoking status: Never Smoker   . Smokeless tobacco: Never Used  . Alcohol Use: Yes     Comment: socially    OB History   Grav Para Term Preterm Abortions TAB SAB Ect Mult Living   0 0 0 0 0 0 0 0 0 0      Review of Systems  Constitutional: Negative for fever, chills and diaphoresis.  Respiratory: Negative for shortness of breath.   Cardiovascular: Positive for chest pain and leg swelling.  All other systems reviewed and are negative.      Allergies  Ivp dye  Home Medications   Current Outpatient Rx  Name  Route  Sig  Dispense  Refill  . BIOTIN PO   Oral   Take 1 tablet by mouth daily.         Marland Kitchen  enoxaparin (LOVENOX) 150 MG/ML injection   Subcutaneous   Inject 0.87 mLs (130 mg total) into the skin daily. START TONIGHT AT 2200 HOURS X 5 DAYS   5 Syringe   0   . HYDROcodone-acetaminophen (LORTAB) 7.5-325 MG per tablet   Oral   Take 1 tablet by mouth every 6 (six) hours as needed for moderate pain.   20 tablet   0   . methylphenidate (CONCERTA) 54 MG CR tablet   Oral   Take 54 mg by mouth every morning.         . miconazole (MONISTAT 7) 2 % vaginal cream   Vaginal   Place 1 Applicatorful vaginally at bedtime. Use for 7 days.   45 g   0   . Naphazoline-Pheniramine (EYE ALLERGY RELIEF OP)   Ophthalmic   Apply 1-2 drops to eye daily as needed (for allergies/irritation).           . warfarin (COUMADIN) 5 MG tablet   Oral   Take 2 tablets (10 mg total) by mouth one time only at 6 PM.   60 tablet   0    BP 114/65  Pulse 69  Temp(Src) 99.5 F (37.5 C) (Oral)  Resp 18  SpO2 100%  LMP 09/19/2013 Physical Exam  Nursing note and vitals reviewed. Constitutional: She is oriented to person, place, and time. She appears well-developed and well-nourished. No distress.  HENT:  Head: Normocephalic.  Cardiovascular: Normal rate and regular rhythm.   No murmur heard. Pulmonary/Chest: Effort normal and breath sounds normal. No respiratory distress. She has no wheezes. She has no rales.  Abdominal: Soft. There is no tenderness.  Musculoskeletal: Normal range of motion. She exhibits edema.  Edema to bilateral lower extremities. There is some tenderness along the posterior lower legs and behind the right knee no gross deformities. Normal distal pulses.  Neurological: She is alert and oriented to person, place, and time.  Skin: Skin is warm and dry. No rash noted.  Psychiatric: She has a normal mood and affect. Her behavior is normal.    ED Course  Procedures   DIAGNOSTIC STUDIES: Oxygen Saturation is 97% on room air.    COORDINATION OF CARE:  Nursing notes reviewed. Vital signs reviewed. Initial pt interview and examination performed.   2:00AM patient seen and evaluated. She is well appearing in no acute distress. She has normal respirations and O2 sats. Normal heart rate. Currently she denies any chest pain pleuritic pain, shortness of breath, cough or hemoptysis. She did report having some chest tightness but stated that she was feeling this was related to anxiety because she is under significant amount of stress I discussed options to rule out a PE with her including a VQ scan at this time she does not wish to have this performed. We also discussed options for ultrasounds of the legs to evaluate for clots and she does wish to have this done.   5:00 AM spoke with  Dr. Posey Pronto on call for [facility] hospitalist. He agrees with plan for bilateral lower extremity Doppler. Would also order EKG, troponin and chest x-ray for her vague complaints of chest discomfort. He will pass on pt info to day [facility] team and they will evaluate patient in consult later this morning.  6:00 AM pt discussed in sign out with Quincy Carnes PA-C.  She will follow [place] and lab results.   Treatment plan initiated: Medications  LORazepam (ATIVAN) tablet 1 mg (1 mg Oral Given 10/11/13 0143)  oxyCODONE-acetaminophen (PERCOCET/ROXICET) 5-325 MG per tablet 1 tablet (1 tablet Oral Given 10/11/13 0235)   Results for orders placed during the hospital encounter of 10/11/13  CBC WITH DIFFERENTIAL      Result Value Ref Range   WBC 11.9 (*) 4.0 - 10.5 K/uL   RBC 4.81  3.87 - 5.11 MIL/uL   Hemoglobin 14.7  12.0 - 15.0 g/dL   HCT 41.0  36.0 - 46.0 %   MCV 85.2  78.0 - 100.0 fL   MCH 30.6  26.0 - 34.0 pg   MCHC 35.9  30.0 - 36.0 g/dL   RDW 12.4  11.5 - 15.5 %   Platelets 310  150 - 400 K/uL   Neutrophils Relative % 57  43 - 77 %   Neutro Abs 6.8  1.7 - 7.7 K/uL   Lymphocytes Relative 34  12 - 46 %   Lymphs Abs 4.0  0.7 - 4.0 K/uL   Monocytes Relative 7  3 - 12 %   Monocytes Absolute 0.8  0.1 - 1.0 K/uL   Eosinophils Relative 3  0 - 5 %   Eosinophils Absolute 0.3  0.0 - 0.7 K/uL   Basophils Relative 0  0 - 1 %   Basophils Absolute 0.1  0.0 - 0.1 K/uL  BASIC METABOLIC PANEL      Result Value Ref Range   Sodium 140  137 - 147 mEq/L   Potassium 4.6  3.7 - 5.3 mEq/L   Chloride 99  96 - 112 mEq/L   CO2 27  19 - 32 mEq/L   Glucose, Bld 78  70 - 99 mg/dL   BUN 10  6 - 23 mg/dL   Creatinine, Ser 0.63  0.50 - 1.10 mg/dL   Calcium 9.8  8.4 - 10.5 mg/dL   GFR calc non Af Amer >90  >90 mL/min   GFR calc Af Amer >90  >90 mL/min  PROTIME-INR      Result Value Ref Range   Prothrombin Time 17.1 (*) 11.6 - 15.2 seconds   INR 1.43  0.00 - 1.49        Imaging Review No results  found.      Date: 10/11/2013  Rate: 69  Rhythm: normal sinus rhythm  QRS Axis: normal  Intervals: normal  ST/T Wave abnormalities: normal  Conduction Disutrbances:none  Narrative Interpretation:   Old EKG Reviewed: unchanged    MDM   Final diagnoses:  None        Martie Lee, PA-C 10/11/13 [phone]

## 2013-10-11 NOTE — ED Notes (Signed)
Pt refused chest xray, feels it is not necessary.

## 2013-10-12 DIAGNOSIS — D6859 Other primary thrombophilia: Secondary | ICD-10-CM

## 2013-10-12 LAB — PROTIME-INR
INR: 1.91 — AB (ref 0.00–1.49)
Prothrombin Time: 21.3 seconds — ABNORMAL HIGH (ref 11.6–15.2)

## 2013-10-12 LAB — CBC
HCT: 39 % (ref 36.0–46.0)
Hemoglobin: 13.7 g/dL (ref 12.0–15.0)
MCH: 29.8 pg (ref 26.0–34.0)
MCHC: 35.1 g/dL (ref 30.0–36.0)
MCV: 85 fL (ref 78.0–100.0)
PLATELETS: 288 10*3/uL (ref 150–400)
RBC: 4.59 MIL/uL (ref 3.87–5.11)
RDW: 12.5 % (ref 11.5–15.5)
WBC: 9.2 10*3/uL (ref 4.0–10.5)

## 2013-10-12 LAB — BASIC METABOLIC PANEL
BUN: 12 mg/dL (ref 6–23)
CO2: 24 meq/L (ref 19–32)
CREATININE: 0.6 mg/dL (ref 0.50–1.10)
Calcium: 8.8 mg/dL (ref 8.4–10.5)
Chloride: 101 mEq/L (ref 96–112)
GFR calc non Af Amer: >90 mL/min (ref >90)
Glucose, Bld: 99 mg/dL (ref 70–99)
Potassium: 3.7 mEq/L (ref 3.7–5.3)
Sodium: 138 mEq/L (ref 137–147)

## 2013-10-12 LAB — APTT: aPTT: 48 seconds — ABNORMAL HIGH (ref 24–37)

## 2013-10-12 MED ORDER — SENNOSIDES-DOCUSATE SODIUM 8.6-50 MG PO TABS
1.0000 | ORAL_TABLET | Freq: Two times a day (BID) | ORAL | Status: DC
  Administered 2013-10-12 – 2013-10-14 (×5): 1 via ORAL
  Filled 2013-10-12 (×5): qty 1

## 2013-10-12 MED ORDER — ENOXAPARIN SODIUM 100 MG/ML ~~LOC~~ SOLN
90.0000 mg | Freq: Two times a day (BID) | SUBCUTANEOUS | Status: DC
  Administered 2013-10-12: 22:00:00 via SUBCUTANEOUS
  Administered 2013-10-12: 90 mg via SUBCUTANEOUS
  Filled 2013-10-12 (×4): qty 1

## 2013-10-12 MED ORDER — PROCHLORPERAZINE EDISYLATE 5 MG/ML IJ SOLN
10.0000 mg | Freq: Once | INTRAMUSCULAR | Status: AC
  Administered 2013-10-12: 10 mg via INTRAVENOUS
  Filled 2013-10-12: qty 2

## 2013-10-12 MED ORDER — WARFARIN SODIUM 7.5 MG PO TABS
7.5000 mg | ORAL_TABLET | Freq: Once | ORAL | Status: AC
  Administered 2013-10-12: 7.5 mg via ORAL
  Filled 2013-10-12: qty 1

## 2013-10-12 MED ORDER — OXYCODONE HCL 5 MG PO TABS
10.0000 mg | ORAL_TABLET | ORAL | Status: DC | PRN
  Administered 2013-10-12 – 2013-10-14 (×9): 10 mg via ORAL
  Filled 2013-10-12 (×10): qty 2

## 2013-10-12 NOTE — ED Provider Notes (Signed)
Medical screening examination/treatment/procedure(s) were performed by non-physician practitioner and as supervising physician I was immediately available for consultation/collaboration.   Maylen Waltermire, MD 10/12/13 0428 

## 2013-10-12 NOTE — Progress Notes (Signed)
[facility] PROGRESS NOTE  Julie Doyle DGU:[id number] DOB: [birth year] DOA: 10/11/2013 PCP: Kelton Pillar, MD  Assessment/Plan: #1 right popliteal knee pain Likely musculoskeletal in nature. Bilateral lower extremity Dopplers negative for right lower extremity DVT however positive for known recent left lower extremity DVT. Warm compresses. Pain management.  #2 left lower extremity DVT INR improved however still subtherapeutic and currently at 1.91. Continue full dose Lovenox. Continue Coumadin. Outpatient followup with hematology.  #3 leukocytosis Likely reactive. No respiratory symptoms. Urinalysis is negative. Improved.  #4 chest tightness Likely secondary to anxiety. Improved on Ativan.  #5 anxiety/reactive depression Ativan as needed.  #6 factor V Leiden mutation heterozygous Outpatient followup with hematology.  #7 prophylaxis On full dose Lovenox for DVT prophylaxis.  Code Status: Full Family Communication: Updated patient no family at bedside. Disposition Plan: Home when medically stable and INR is therapeutic.   Consultants:  None  Procedures:  Bilateral lower extremity Dopplers 10/11/2013    Antibiotics:  None  HPI/Subjective: Patient complaining of a headache with some photosensitivity. Patient states right popliteal knee pain now intermittent. Patient with some left lower extremity pain.  Objective: Filed Vitals:   10/12/13 0609  BP: 105/57  Pulse: 79  Temp: 98.3 F (36.8 C)  Resp: 16    Intake/Output Summary (Last 24 hours) at 10/12/13 1009 Last data filed at 10/12/13 0800  Gross per 24 hour  Intake   1163 ml  Output      0 ml  Net   1163 ml   There were no vitals filed for this visit.  Exam:   General:  NAD  Cardiovascular: RRR  Respiratory: CTAB  Abdomen: Soft, nontender, nondistended, positive bowel sounds.  Musculoskeletal: Some tenderness to palpation in the right popliteal region. Some tenderness to  palpation in the left lower extremity.  Data Reviewed: Basic Metabolic Panel:  Recent Labs Lab 10/07/13 1615 10/08/13 0645 10/09/13 0635 10/11/13 0110 10/12/13 0510  NA 142 140 138 140 138  K 4.2 3.5* 3.8 4.6 3.7  CL 103 104 103 99 101  CO2 25 25 22 27 24   GLUCOSE 98 92 94 78 99  BUN 12 12 11 10 12   CREATININE 0.70 0.80 0.59 0.63 0.60  CALCIUM 9.6 8.8 8.7 9.8 8.8  MG  --   --   --  2.0  --    Liver Function Tests:  Recent Labs Lab 10/08/13 0645  AST 13  ALT 8  ALKPHOS 36*  BILITOT 0.3  PROT 6.3  ALBUMIN 3.2*   No results found for this basename: LIPASE, AMYLASE,  in the last 168 hours No results found for this basename: AMMONIA,  in the last 168 hours CBC:  Recent Labs Lab 10/07/13 1615 10/08/13 0645 10/09/13 0635 10/11/13 0110 10/12/13 0510  WBC 11.2* 8.3 8.8 11.9* 9.2  NEUTROABS  --  3.5  --  6.8  --   HGB 14.3 12.6 12.9 14.7 13.7  HCT 41.4 36.2 37.2 41.0 39.0  MCV 87.9 86.8 86.7 85.2 85.0  PLT 336 273 279 310 288   Cardiac Enzymes:  Recent Labs Lab 10/07/13 2239 10/08/13 0645 10/08/13 1220  TROPONINI <0.30 <0.30 <0.30   BNP (last 3 results) No results found for this basename: PROBNP,  in the last 8760 hours CBG: No results found for this basename: GLUCAP,  in the last 168 hours  Recent Results (from the past 240 hour(s))  URINE CULTURE     Status: None   Collection Time    10/06/13  1:03 PM      Result Value Ref Range Status   Colony Count NO GROWTH   Final   Organism ID, Bacteria NO GROWTH   Final     Studies: No results found.  Scheduled Meds: . enoxaparin (LOVENOX) injection  90 mg Subcutaneous Q12H  . methylphenidate  54 mg Oral BH-q7a  . prochlorperazine  10 mg Intravenous Once  . prochlorperazine  10 mg Intravenous Once  . senna-docusate  1 tablet Oral BID  . sodium chloride  3 mL Intravenous Q12H  . warfarin  10 mg Oral q1800  . Warfarin - Pharmacist Dosing Inpatient   Does not apply q1800   Continuous Infusions:    Principal Problem:   Right leg pain Active Problems:   DVT (deep venous thrombosis)   Factor 5 Leiden mutation, heterozygous   Anxiety state, unspecified   Reactive depression (situational)   Leukocytosis, unspecified   Chest tightness    Time spent: Matthews MD [facility] Pager [phone]. If 7PM-7AM, please contact night-coverage at [email or web address], password [facility] 10/12/2013, 10:09 AM  LOS: 1 day

## 2013-10-12 NOTE — Progress Notes (Signed)
ANTICOAGULATION CONSULT NOTE - Follow up  Pharmacy Consult for Lovenox and Coumadin Indication: DVT  Allergies  Allergen Reactions  . Ivp Dye [Iodinated Diagnostic Agents] Hives and Shortness Of Breath    Patient Measurements:  Weight: 86kg  Vital Signs: Temp: 98.3 F (36.8 C) (03/15 0609) BP: 105/57 mmHg (03/15 0609) Pulse Rate: 79 (03/15 0609)  Labs:  Recent Labs  10/10/13 1639 10/11/13 0110 10/12/13 0510  HGB  --  14.7 13.7  HCT  --  41.0 39.0  PLT  --  310 288  APTT  --   --  48*  LABPROT 15.4* 17.1* 21.3*  INR 1.24 1.43 1.91*  CREATININE  --  0.63 0.60    The CrCl is unknown because both a height and weight (above a minimum accepted value) are required for this calculation.   Medical History: Past Medical History  Diagnosis Date  . Factor 5 Leiden mutation, heterozygous   . Anxiety   . Clotting disorder   . Dysmenorrhea   . HSV-1 infection   . Blood dyscrasia     factor V  . Ovarian cyst     Medications:  Prescriptions prior to admission  Medication Sig Dispense Refill  . BIOTIN PO Take 1 tablet by mouth daily.      Marland Kitchen enoxaparin (LOVENOX) 150 MG/ML injection Inject 0.87 mLs (130 mg total) into the skin daily. START TONIGHT AT 2200 HOURS X 5 DAYS  5 Syringe  0  . HYDROcodone-acetaminophen (LORTAB) 7.5-325 MG per tablet Take 1 tablet by mouth every 6 (six) hours as needed for moderate pain.  20 tablet  0  . methylphenidate (CONCERTA) 54 MG CR tablet Take 54 mg by mouth every morning.      . miconazole (MONISTAT 7) 2 % vaginal cream Place 1 Applicatorful vaginally at bedtime. Use for 7 days.  45 g  0  . Naphazoline-Pheniramine (EYE ALLERGY RELIEF OP) Apply 1-2 drops to eye daily as needed (for allergies/irritation).       . warfarin (COUMADIN) 5 MG tablet Take 2 tablets (10 mg total) by mouth one time only at 6 PM.  60 tablet  0    Assessment: Today the INR is 1.91, increasing toward goal of 2-3 in this 27yof with hx Factor V Leiden deficiency and  acute LLE DVT. CBC is stable with pltc 288 on lovenox 90 mg SQ q12h bridge to coumadin. No significant bleeding reported. INR 1.91 today after past 3 days of 10mg  daily. Warfarin predictor point score = 7 points.   INR increased from 1.43 to 1.91.   She was discharged on 3/12 with acute DVT receiving treatment with Lovenox bridging to Coumadin. Patient readmitted 3/14 with continued leg pain. Lovenox and coumadin began on 3/10 late PM.  Patient had received coumadin 7.5mg , 7.5mg  then 10mg  prior to discharge on 3/12. Patient was discharged on Lovenox 130mg  daily (was actually using the entire 150mg  syringe - last dose 3/13 @ 2130) and Coumadin 10mg  daily (last dose 3/13). Admit INR was subtherapeutic at 1.43 but had begun trending up based on previous INR of 1.24 (drawn as outpatient).  - H/H and Plts wnl on admit - No significant bleeding reported on admit - LFTs wnl - Dopplers bilateral repeated 10/11/13 - no new DVT found   Goal of Therapy:  INR 2-3 Anti-Xa level 0.6-1 units/ml 4hrs after LMWH dose given Monitor platelets by anticoagulation protocol: Yes   Plan:  Continue Lovenox to 90mg  (~1mg /kg) SQ q12h 2. Continue Coumadin 7.5  mg PO x 1 today  Daily INR, CBC q72h  Nicole Cella, RPh Clinical Pharmacist Pager: [phone] 10/12/2013,11:20 AM

## 2013-10-12 NOTE — Progress Notes (Signed)
OT Cancellation Note  Patient Details Name: SHIMA COMPERE MRN: [id number] DOB: 19-Dec-1985   Cancelled Treatment:    Reason Eval/Treat Not Completed: OT screened, no needs identified, will sign off  Benito Mccreedy OTR/L [phone] 10/12/2013, 10:45 AM

## 2013-10-12 NOTE — ED Provider Notes (Signed)
Medical screening examination/treatment/procedure(s) were performed by non-physician practitioner and as supervising physician I was immediately available for consultation/collaboration.   Julianne Rice, MD 10/12/13 [phone]

## 2013-10-12 NOTE — Evaluation (Signed)
Physical Therapy Evaluation Patient Details Name: Julie Doyle MRN: [id number] DOB: 1986-07-17 Today's Date: 10/12/2013 Time: 1030-1046 PT Time Calculation (min): 16 min  PT Assessment / Plan / Recommendation History of Present Illness  Pt admitted with RLE pain.  She was dx with LLE DVT recently.  She also had sx to remove ovarian cyst approx 2 weeks ago.  Clinical Impression  Pt is independent with all mobility.  No skilled PT indicated.  PT signing off.    PT Assessment  Patent does not need any further PT services    Follow Up Recommendations  No PT follow up    Does the patient have the potential to tolerate intense rehabilitation      Barriers to Discharge        Equipment Recommendations  None recommended by PT    Recommendations for Other Services     Frequency      Precautions / Restrictions Precautions Precautions: None   Pertinent Vitals/Pain       Mobility  Bed Mobility Overal bed mobility: Independent Transfers Overall transfer level: Independent Equipment used: None Ambulation/Gait Ambulation/Gait assistance: Independent Ambulation Distance (Feet): 650 Feet Assistive device: None Gait Pattern/deviations: WFL(Within Functional Limits) Gait velocity interpretation: at or above normal speed for age/gender    Exercises     PT Diagnosis:    PT Problem List:   PT Treatment Interventions:       PT Goals(Current goals can be found in the care plan section) Acute Rehab PT Goals Patient Stated Goal: feel better PT Goal Formulation: No goals set, d/c therapy  Visit Information  Last PT Received On: 10/12/13 Assistance Needed: +1 History of Present Illness: Pt admitted with RLE pain.  She was dx with LLE DVT recently.  She also had sx to remove ovarian cyst approx 2 weeks ago.       Prior [place] expects to be discharged to:: Private residence Living Arrangements: Other relatives Available Help at  Discharge: Family;Available 24 hours/day Type of Home: House Home Access: Stairs to enter [employer] of Steps: 2 Home Layout: One level Home Equipment: None Prior Function Level of Independence: Independent Communication Communication: No difficulties    Cognition  Cognition Arousal/Alertness: Awake/alert Behavior During Therapy: WFL for tasks assessed/performed Overall Cognitive Status: Within Functional Limits for tasks assessed    Extremity/Trunk Assessment Upper Extremity Assessment Upper Extremity Assessment: Overall WFL for tasks assessed Lower Extremity Assessment Lower Extremity Assessment: Overall WFL for tasks assessed   Balance Balance Overall balance assessment: Independent  End of Session PT - End of Session Equipment Utilized During Treatment: Gait belt Activity Tolerance: Patient tolerated treatment well Patient left: in chair Nurse Communication: Mobility status  GP     Lorriane Shire 10/12/2013, 1:02 PM  Lorrin Goodell, PT  Office # [phone] Pager [phone]

## 2013-10-13 ENCOUNTER — Ambulatory Visit: Payer: BC Managed Care – PPO | Admitting: Internal Medicine

## 2013-10-13 LAB — BASIC METABOLIC PANEL
BUN: 12 mg/dL (ref 6–23)
CHLORIDE: 100 meq/L (ref 96–112)
CO2: 20 mEq/L (ref 19–32)
Calcium: 9.1 mg/dL (ref 8.4–10.5)
Creatinine, Ser: 0.64 mg/dL (ref 0.50–1.10)
GFR calc Af Amer: >90 mL/min (ref >90)
Glucose, Bld: 91 mg/dL (ref 70–99)
Potassium: 4.3 mEq/L (ref 3.7–5.3)
Sodium: 135 mEq/L — ABNORMAL LOW (ref 137–147)

## 2013-10-13 LAB — PROTIME-INR
INR: 1.9 — ABNORMAL HIGH (ref 0.00–1.49)
Prothrombin Time: 21.2 seconds — ABNORMAL HIGH (ref 11.6–15.2)

## 2013-10-13 LAB — URINE CULTURE: Colony Count: 30000

## 2013-10-13 MED ORDER — WARFARIN SODIUM 10 MG PO TABS
10.0000 mg | ORAL_TABLET | Freq: Once | ORAL | Status: DC
  Filled 2013-10-13: qty 1

## 2013-10-13 MED ORDER — ENOXAPARIN SODIUM 150 MG/ML ~~LOC~~ SOLN
150.0000 mg | SUBCUTANEOUS | Status: DC
  Administered 2013-10-13 – 2013-10-14 (×2): 150 mg via SUBCUTANEOUS
  Filled 2013-10-13 (×2): qty 1

## 2013-10-13 MED ORDER — ENOXAPARIN SODIUM 150 MG/ML ~~LOC~~ SOLN: 150.0000 mg | SUBCUTANEOUS | Status: DC

## 2013-10-13 MED ORDER — WARFARIN SODIUM 10 MG PO TABS
10.0000 mg | ORAL_TABLET | ORAL | Status: AC
  Administered 2013-10-13: 10 mg via ORAL
  Filled 2013-10-13: qty 1

## 2013-10-13 NOTE — Progress Notes (Signed)
[facility] PROGRESS NOTE  Julie Doyle WER:[id number] DOB: 1985/10/13 DOA: 10/11/2013 PCP: Kelton Pillar, MD  Assessment/Plan: #1 right popliteal knee pain Likely musculoskeletal in nature. Bilateral lower extremity Dopplers negative for right lower extremity DVT however positive for known recent left lower extremity DVT. Warm compresses. Pain management.  #2 left lower extremity DVT INR improved however still subtherapeutic and currently at 1.90. Continue full dose Lovenox. Continue Coumadin. Outpatient followup with hematology.  #3 leukocytosis Likely reactive. No respiratory symptoms. Urinalysis is negative.resolved.  #4 chest tightness Likely secondary to anxiety. Improved on Ativan.  #5 anxiety/reactive depression Ativan as needed.  #6 factor V Leiden mutation heterozygous Outpatient followup with hematology.  #7 prophylaxis On full dose Lovenox for DVT prophylaxis.  Code Status: Full Family Communication: Updated patient no family at bedside. Disposition Plan: Home when medically stable and INR is therapeutic, hopefully tomorrow.   Consultants:  None  Procedures:  Bilateral lower extremity Dopplers 10/11/2013    Antibiotics:  None  HPI/Subjective: Patient states headache has improved. Patient states right popliteal knee pain improvement. Patient with some left lower extremity pain.  Objective: Filed Vitals:   10/13/13 1300  BP: 118/63  Pulse: 83  Temp: 99.1 F (37.3 C)  Resp: 18    Intake/Output Summary (Last 24 hours) at 10/13/13 1840 Last data filed at 10/13/13 1400  Gross per 24 hour  Intake    923 ml  Output      0 ml  Net    923 ml   Filed Weights   10/13/13 1052  Weight: 86.2 kg (190 lb 0.6 oz)    Exam:   General:  NAD  Cardiovascular: RRR  Respiratory: CTAB  Abdomen: Soft, nontender, nondistended, positive bowel sounds.  Musculoskeletal: Decreased tenderness to palpation in the right popliteal region. Some  tenderness to palpation in the left lower extremity.  Data Reviewed: Basic Metabolic Panel:  Recent Labs Lab 10/08/13 0645 10/09/13 0635 10/11/13 0110 10/12/13 0510 10/13/13 0520  NA 140 138 140 138 135*  K 3.5* 3.8 4.6 3.7 4.3  CL 104 103 99 101 100  CO2 25 22 27 24 20   GLUCOSE 92 94 78 99 91  BUN 12 11 10 12 12   CREATININE 0.80 0.59 0.63 0.60 0.64  CALCIUM 8.8 8.7 9.8 8.8 9.1  MG  --   --  2.0  --   --    Liver Function Tests:  Recent Labs Lab 10/08/13 0645  AST 13  ALT 8  ALKPHOS 36*  BILITOT 0.3  PROT 6.3  ALBUMIN 3.2*   No results found for this basename: LIPASE, AMYLASE,  in the last 168 hours No results found for this basename: AMMONIA,  in the last 168 hours CBC:  Recent Labs Lab 10/07/13 1615 10/08/13 0645 10/09/13 0635 10/11/13 0110 10/12/13 0510  WBC 11.2* 8.3 8.8 11.9* 9.2  NEUTROABS  --  3.5  --  6.8  --   HGB 14.3 12.6 12.9 14.7 13.7  HCT 41.4 36.2 37.2 41.0 39.0  MCV 87.9 86.8 86.7 85.2 85.0  PLT 336 273 279 310 288   Cardiac Enzymes:  Recent Labs Lab 10/07/13 2239 10/08/13 0645 10/08/13 1220  TROPONINI <0.30 <0.30 <0.30   BNP (last 3 results) No results found for this basename: PROBNP,  in the last 8760 hours CBG: No results found for this basename: GLUCAP,  in the last 168 hours  Recent Results (from the past 240 hour(s))  URINE CULTURE     Status: None  Collection Time    10/06/13  1:03 PM      Result Value Ref Range Status   Colony Count NO GROWTH   Final   Organism ID, Bacteria NO GROWTH   Final  URINE CULTURE     Status: None   Collection Time    10/11/13 11:23 AM      Result Value Ref Range Status   Specimen Description URINE, CLEAN CATCH   Final   Special Requests NONE   Final   Culture  Setup Time     Final   Value: 10/11/2013 18:54     Performed at [place]     Final   Value: 30,000 COLONIES/ML     Performed at [facility]   Culture     Final   Value: GROUP B  STREP(S.AGALACTIAE)ISOLATED     Note: TESTING AGAINST S. AGALACTIAE NOT ROUTINELY PERFORMED DUE TO PREDICTABILITY OF AMP/PEN/VAN SUSCEPTIBILITY.     Performed at [facility]   Report Status 10/13/2013 FINAL   Final     Studies: No results found.  Scheduled Meds: . enoxaparin (LOVENOX) injection  150 mg Subcutaneous Q24H  . methylphenidate  54 mg Oral BH-q7a  . prochlorperazine  10 mg Intravenous Once  . senna-docusate  1 tablet Oral BID  . sodium chloride  3 mL Intravenous Q12H  . Warfarin - Pharmacist Dosing Inpatient   Does not apply q1800   Continuous Infusions:   Principal Problem:   Right leg pain Active Problems:   DVT (deep venous thrombosis)   Factor 5 Leiden mutation, heterozygous   Anxiety state, unspecified   Reactive depression (situational)   Leukocytosis, unspecified   Chest tightness    Time spent: [place] MD [facility] Pager [phone]. If 7PM-7AM, please contact night-coverage at [email or web address], password [facility] 10/13/2013, 6:40 PM  LOS: 2 days

## 2013-10-13 NOTE — Progress Notes (Signed)
Utilization review completed.  

## 2013-10-13 NOTE — Care Management Note (Signed)
CARE MANAGEMENT NOTE 10/13/2013  Patient:  Julie Doyle, Julie Doyle   Account Number:  [id number]  Date Initiated:  10/13/2013  Documentation initiated by:  Ricki Miller  Subjective/Objective Assessment:   28 yr old female admited with right leg pain, left leg DVT.     Action/Plan:   MD wants patient to have PT/INR checked on 10/14/13, CM called [place] [phone], patient may stop in anytime between 8:30am & 5:00pm. Informed patient of this, she is familiar with Lab. No further CM needs.   Anticipated DC Date:  10/13/2013   Anticipated DC Plan:  [place]  CM consult      Choice offered to / List presented to:             Status of service:  Completed, signed off Medicare Important Message given?   (If response is "NO", the following Medicare IM given date fields will be blank) Date Medicare IM given:   Date Additional Medicare IM given:    Discharge Disposition:  HOME/SELF CARE

## 2013-10-13 NOTE — Progress Notes (Signed)
Pharmacy Note-Anticoagulation  Pharmacy Consult :  28 y.o. female is currently on Coumadin with Lovenox bridging for DVT with history of Factor 5 Leiden mutation.   Latest Labs : Hematology :  Recent Labs  10/10/13 1639 10/11/13 0110 10/12/13 0510 10/13/13 0520  HGB  --  14.7 13.7  --   HCT  --  41.0 39.0  --   PLT  --  310 288  --   APTT  --   --  48*  --   LABPROT 15.4* 17.1* 21.3* 21.2*  INR 1.24 1.43 1.91* 1.90*  CREATININE  --  0.63 0.60 0.64    Lab Results  Component Value Date   INR 1.90* 10/13/2013   INR 1.91* 10/12/2013   INR 1.43 10/11/2013        PROTIME 21.6* 03/16/2009   PROTIME 16.8* 03/09/2009   PROTIME 16.8* 03/05/2009        HGB 13.7 10/12/2013   HGB 14.7 10/11/2013   HGB 12.9 10/09/2013    Current Medication[s] Include: Medication PTA: Prescriptions prior to admission  Medication Sig Dispense Refill  . BIOTIN PO Take 1 tablet by mouth daily.      Marland Kitchen enoxaparin (LOVENOX) 150 MG/ML injection Inject 0.87 mLs (130 mg total) into the skin daily. START TONIGHT AT 2200 HOURS X 5 DAYS  5 Syringe  0  . HYDROcodone-acetaminophen (LORTAB) 7.5-325 MG per tablet Take 1 tablet by mouth every 6 (six) hours as needed for moderate pain.  20 tablet  0  . methylphenidate (CONCERTA) 54 MG CR tablet Take 54 mg by mouth every morning.      . miconazole (MONISTAT 7) 2 % vaginal cream Place 1 Applicatorful vaginally at bedtime. Use for 7 days.  45 g  0  . Naphazoline-Pheniramine (EYE ALLERGY RELIEF OP) Apply 1-2 drops to eye daily as needed (for allergies/irritation).       . warfarin (COUMADIN) 5 MG tablet Take 2 tablets (10 mg total) by mouth one time only at 6 PM.  60 tablet  0   Scheduled:  Scheduled:  . enoxaparin (LOVENOX) injection  90 mg Subcutaneous Q12H  . methylphenidate  54 mg Oral BH-q7a  . prochlorperazine  10 mg Intravenous Once  . senna-docusate  1 tablet Oral BID  . sodium chloride  3 mL Intravenous Q12H  . Warfarin - Pharmacist Dosing Inpatient   Does not  apply q1800    Assessment :  Today's INR is still slightly Sub-therapeutic.   INR is 1.90.    Lovenox bridging continuing until INR therapeutic.  No bleeding complications observed.  Goal :  INR goal is 2-3    Lovenox Anti-Xa level 0.6-1 units/ml 4hrs after LMWH dose given.  Plan : 1. Continue Lovenox bridging, 90 mg sq q 12 hours. 2. Increase Coumadin to 10 mg po today. 3. Daily INR's, CBC. Monitor for bleeding complications 4. Patient educated 10/11/13.  Halley Kincer, Craig Guess, Pharm.D. 10/13/2013  11:08 AM

## 2013-10-13 NOTE — Progress Notes (Signed)
Brief Nutrition Note:  Consult received for coumadin diet education.  When entered the room pt on the phone with her therapist. Pt asked that I return in > 1 hour. I am unavailable at that time, discussed returning morning of 3/17.  Pt agreeable.  Did leave diet education handout and quickly reviewed main points of education.  Pt eats a lot of high vitamin k foods, reinforced with pt the importance of consistency of vitamin K intake.   [place], [place], [place] Pager [phone] After Hours Pager

## 2013-10-14 ENCOUNTER — Other Ambulatory Visit: Payer: Self-pay | Admitting: *Deleted

## 2013-10-14 ENCOUNTER — Ambulatory Visit: Payer: BC Managed Care – PPO | Admitting: Internal Medicine

## 2013-10-14 ENCOUNTER — Ambulatory Visit (INDEPENDENT_AMBULATORY_CARE_PROVIDER_SITE_OTHER): Payer: BC Managed Care – PPO | Admitting: Gynecology

## 2013-10-14 ENCOUNTER — Encounter: Payer: Self-pay | Admitting: Gynecology

## 2013-10-14 VITALS — BP 102/81 | HR 109 | Temp 100.0°F | Resp 20 | Ht 66.0 in | Wt 192.0 lb

## 2013-10-14 DIAGNOSIS — Z9889 Other specified postprocedural states: Secondary | ICD-10-CM

## 2013-10-14 DIAGNOSIS — D369 Benign neoplasm, unspecified site: Secondary | ICD-10-CM

## 2013-10-14 DIAGNOSIS — I82409 Acute embolism and thrombosis of unspecified deep veins of unspecified lower extremity: Secondary | ICD-10-CM

## 2013-10-14 LAB — PROTIME-INR
INR: 1.93 — ABNORMAL HIGH (ref 0.00–1.49)
Prothrombin Time: 21.5 seconds — ABNORMAL HIGH (ref 11.6–15.2)

## 2013-10-14 MED ORDER — LORAZEPAM 1 MG PO TABS: 1.0000 mg | ORAL_TABLET | Freq: Three times a day (TID) | ORAL | Status: DC | PRN

## 2013-10-14 MED ORDER — OXYCODONE HCL 10 MG PO TABS: 10.0000 mg | ORAL_TABLET | ORAL | Status: DC | PRN

## 2013-10-14 MED ORDER — WARFARIN SODIUM 2.5 MG PO TABS
12.5000 mg | ORAL_TABLET | ORAL | Status: AC
  Administered 2013-10-14: 12.5 mg via ORAL
  Filled 2013-10-14: qty 1

## 2013-10-14 MED ORDER — METHYLPHENIDATE HCL ER (OSM) 36 MG PO TBCR: 36.0000 mg | EXTENDED_RELEASE_TABLET | ORAL | Status: DC

## 2013-10-14 MED ORDER — METHYLPHENIDATE HCL ER (OSM) 54 MG PO TBCR: 54.0000 mg | EXTENDED_RELEASE_TABLET | ORAL | Status: DC

## 2013-10-14 MED ORDER — WARFARIN SODIUM 2.5 MG PO TABS
12.5000 mg | ORAL_TABLET | ORAL | Status: DC
  Filled 2013-10-14: qty 1

## 2013-10-14 NOTE — Discharge Summary (Addendum)
Physician Discharge Summary  TULANI KIDNEY TKW:[id number] DOB: 10-Nov-1985 DOA: 10/11/2013  PCP: Kelton Pillar, MD  Admit date: 10/11/2013 Discharge date: 10/14/2013  Time spent: 65 minutes  Recommendations for Outpatient Follow-up:  1. Followup with SCHOENHOFF,DEBBIE, MD in 1 week. 2. Followup with GYN as scheduled this afternoon. 3. Followup with Dr. Barbette Hair arch of hematology as previously scheduled on 01/09/2012. 4. Patient is to followup at [facility], MD office on Thursday, 10/16/2052 PT/INR checked and further recommendations on the Coumadin and Lovenox.  Discharge Diagnoses:  Principal Problem:   Right leg pain Active Problems:   DVT (deep venous thrombosis)   Factor 5 Leiden mutation, heterozygous   Anxiety state, unspecified   Reactive depression (situational)   Leukocytosis, unspecified   Chest tightness   Discharge Condition: stable and improved.  Diet recommendation: Regular  Filed Weights   10/13/13 1052  Weight: 86.2 kg (190 lb 0.6 oz)    History of present illness:  Julie Doyle is a 28 y.o. female  With hx of newly diagnosed LLE DVT, recent laparoscopic resection of R ovarian cyst 09/28/13 per Dr Charlies Constable, history of factor V Leiden heterozygous mutation however only a carrier, history of implantable IUD who was recently admitted 3/10 to 10/09/2013 for newly diagnosed left lower extremity DVT affecting the peroneal nerve. Ultrasound which was done was done over the left lower extremity only. VQ scan which was done showed a low probability for PE. Patient was discharged home on full dose Lovenox and Coumadin which she states she was compliant with.  Patient presents back to the ED with complaints of right behind the knee pain with radiation to the upper thigh. Patient unable to directly stated as to whether we have some associated edema. Patient also with some complaints of chest tightness and some shortness of breath which she attributes to stress and  recent family issues which are ongoing. Patient was given a milligram of Ativan with improvement in the chest tightness and shortness of breath and feels close to baseline. Patient denies any presyncopal episodes, no edema, no hemoptysis, no palpitations. Patient denies any smoking history. Patient denies any fevers, no chills, no nausea, no vomiting, no abdominal pain, no diarrhea, no constipation, no dysuria. No cough. Patient does endorse some generalized weakness.  Patient was seen in the ED bilateral lower extremity Dopplers are pending.  We will consulted to admit the patient for further evaluation and management. INR noted in the ED was 1.43. Basic metabolic profile is unremarkable. CBC had a white count of 11.9 otherwise was within normal limits.      Hospital Course:  #1 right popliteal knee pain  Likely musculoskeletal in nature. Bilateral lower extremity Dopplers negative for right lower extremity DVT however positive for known recent left lower extremity DVT. Warm compresses were applied with improvement of patient's pain. Patient be discharged in stable and improved condition. #2 left lower extremity DVT  INR improved however still subtherapeutic and currently at 1.93. Continue full dose Lovenox. Continue Coumadin. Patient will need a PT INR checked on Thursday, 10/16/2013. Outpatient followup with hematology as already scheduled.  #3 leukocytosis  Likely reactive. No respiratory symptoms. Urinalysis is negative resolved.  #4 chest tightness  Likely secondary to anxiety. Improved on Ativan.  #5 anxiety/reactive depression  Ativan as needed.  #6 factor V Leiden mutation heterozygous  Outpatient followup with hematology.      Procedures: Bilateral lower extremity Dopplers 10/11/2013   Consultations:  None  Discharge Exam: Filed Vitals:   10/14/13 9242  BP: 105/49  Pulse: 89  Temp: 98.2 F (36.8 C)  Resp: 18    General: NAD Cardiovascular: RRR Respiratory:  CTAB  Discharge Instructions      Discharge Orders   Future Appointments Provider Department Dept Phone   10/14/2013 4:30 PM Azalia Bilis, MD [place] [id number]   10/16/2013 12:45 PM Lanice Shirts, MD [facility] [phone]   01/07/2014 9:00 AM [facility] [phone]   01/08/2014 8:15 AM [facility] [phone]   01/08/2014 8:45 AM Heath Lark, MD [facility] [phone]   Future Orders Complete By Expires   Diet general  As directed    Discharge instructions  As directed    Comments:     Follow up with GYN as scheduled. Follow up with Dr Elson Areas of hematology as scheduled. Follow up with SCHOENHOFF,DEBBIE, MD in 1 week. PT/INR, coumadin check on Thursday 10/16/13.   Increase activity slowly  As directed        Medication List    STOP taking these medications       HYDROcodone-acetaminophen 7.5-325 MG per tablet  Commonly known as:  LORTAB     miconazole 2 % vaginal cream  Commonly known as:  MONISTAT 7      TAKE these medications       BIOTIN PO  Take 1 tablet by mouth daily.     enoxaparin 150 MG/ML injection  Commonly known as:  LOVENOX  Inject 0.87 mLs (130 mg total) into the skin daily. START TONIGHT AT 2200 HOURS X 5 DAYS     EYE ALLERGY RELIEF OP  Apply 1-2 drops to eye daily as needed (for allergies/irritation).     LORazepam 1 MG tablet  Commonly known as:  ATIVAN  Take 1 tablet (1 mg total) by mouth every 8 (eight) hours as needed for anxiety.     methylphenidate 36 MG CR tablet  Commonly known as:  CONCERTA  Take 1 tablet (36 mg total) by mouth every morning.     Oxycodone HCl 10 MG Tabs  Take 1 tablet (10 mg total) by mouth every 4 (four) hours as needed for moderate pain.     warfarin 5 MG tablet  Commonly known as:  COUMADIN  Take 2 tablets (10  mg total) by mouth one time only at 6 PM.       Allergies  Allergen Reactions  . Ivp Dye [Iodinated Diagnostic Agents] Hives and Shortness Of Breath   Follow-up Information   Follow up with SCHOENHOFF,DEBBIE, MD. Schedule an appointment as soon as possible for a visit in 1 week.   Specialty:  Internal Medicine   Contact information:   [address] [place] [address] [phone]       Follow up with [facility], NI, MD. (f/u as scheduled)    Specialty:  Hematology and Oncology   Contact information:   [place] [address] [phone]        The results of significant diagnostics from this hospitalization (including imaging, microbiology, ancillary and laboratory) are listed below for reference.    Significant Diagnostic Studies: Dg Chest 2 View  10/08/2013   CLINICAL DATA Shortness of breath, chest pain.  EXAM CHEST  2 VIEW  COMPARISON 05/17/2004  FINDINGS Heart size and mediastinal contours within normal range. No confluent airspace opacity, pleural effusion, or pneumothorax.  Gentle right curvature of the lower thoracic/ upper lumbar spine. No acute osseous finding.  IMPRESSION No radiographic evidence of an acute cardiopulmonary process.  SIGNATURE  Electronically Signed   By: Carlos Levering M.D.   On: 10/08/2013 04:44   US Transvaginal Non-ob  09/27/2013   CLINICAL DATA:  Acute right-sided pain, evaluate for torsion  EXAM: TRANSVAGINAL ULTRASOUND OF PELVIS  DOPPLER ULTRASOUND OF OVARIES  TECHNIQUE: Transvaginal ultrasound examination of the pelvis was performed including evaluation of the uterus, ovaries, adnexal regions, and pelvic cul-de-sac.  Color and duplex Doppler ultrasound was utilized to evaluate blood flow to the ovaries.  COMPARISON:  [facility] pelvic ultrasound dated 09/23/2013  FINDINGS: Uterus  Measurements: 7.8 x 3.6 x 4.5 cm. No fibroids or other mass visualized.  Endometrium  Poorly visualized.  IUD in  satisfactory position.  Right ovary  Measurements: 7.9 x 4.9 x 3.8 cm. Dominant 4.6 x 4.5 x 4.5 cm predominantly simple cyst but with possible 16 x 13 mm peripheral mural nodule (image 20). Additional 2.8 x 2.9 x 2.6 cm complex/septated cyst.  Left ovary  Measurements: 3.4 x 2.1 x 4.0 cm. Normal appearance/no adnexal mass.  Pulsed Doppler evaluation demonstrates normal low-resistance arterial and venous waveforms in both ovaries.  Additional comments: Trace pelvic fluid.  IMPRESSION: No evidence of ovarian torsion.  4.6 cm cystic right ovarian lesion with possible mural nodule. Additional 2.9 cm complex/septated right ovarian cyst. Despite the appearance, these findings are likely physiologic given the patient's age, and follow-up pelvic ultrasound is suggested in 6-10 weeks. If persistent, consider pelvic MRI with/without contrast for further evaluation.  IUD in satisfactory position.   Electronically Signed   By: Julian Hy M.D.   On: 09/27/2013 17:31   US Transvaginal Non-ob  09/23/2013   SEE PROGRESS NOTES FOR RESULTS   Nm Pulmonary Perf And Vent  10/08/2013   CLINICAL DATA Postop week 1, positive DVT. Shortness of breath and chest pain. Unable to have CT contrast due to allergy.  EXAM NUCLEAR MEDICINE VENTILATION - PERFUSION LUNG SCAN  TECHNIQUE Ventilation images were obtained in multiple projections using inhaled aerosol technetium 99 M DTPA. Perfusion images were obtained in multiple projections after intravenous injection of Tc-28m MAA.  RADIOPHARMACEUTICALS Forty mCi Tc-61m DTPA aerosol and 6 mCi Tc-65m MAA  COMPARISON Same day chest radiograph  FINDINGS Ventilation: No focal ventilation defect.  Perfusion: No wedge shaped peripheral perfusion defects to suggest acute pulmonary embolism.  IMPRESSION Low probability for pulmonary embolism.  SIGNATURE  Electronically Signed   By: Carlos Levering M.D.   On: 10/08/2013 04:21   US Venous Img Lower Unilateral Left  10/07/2013   CLINICAL DATA  Factor 5 Leiden deficiency.  Left calf pain.  EXAM Left LOWER EXTREMITY VENOUS DOPPLER ULTRASOUND  TECHNIQUE Gray-scale sonography with graded compression, as well as color Doppler and duplex ultrasound, were performed to evaluate the deep venous system from the level of the common femoral vein through the popliteal and proximal calf veins. Spectral Doppler was utilized to evaluate flow at rest and with distal augmentation maneuvers.  COMPARISON None.  FINDINGS Thrombus within deep veins: There is thrombus identified in the left peroneal vein. There is no other thrombus visualized.  Compressibility of deep veins: The left peroneal vein is noncompressible. The remainder of the deep venous system is compressible.  Duplex waveform respiratory phasicity:  Normal.  Duplex waveform response to augmentation:  Normal.  Venous reflux:  None visualized.  Other findings: There is no color  flow identified within the left peroneal vein.  IMPRESSION 1. Occlusive thrombosis of the left peroneal vein.  SIGNATURE  Electronically Signed   By: Kathreen Devoid   On: 10/07/2013 13:53    Microbiology: Recent Results (from the past 240 hour(s))  URINE CULTURE     Status: None   Collection Time    10/06/13  1:03 PM      Result Value Ref Range Status   Colony Count NO GROWTH   Final   Organism ID, Bacteria NO GROWTH   Final  URINE CULTURE     Status: None   Collection Time    10/11/13 11:23 AM      Result Value Ref Range Status   Specimen Description URINE, CLEAN CATCH   Final   Special Requests NONE   Final   Culture  Setup Time     Final   Value: 10/11/2013 18:54     Performed at [facility]     Final   Value: 30,000 COLONIES/ML     Performed at [facility]   Culture     Final   Value: GROUP B STREP(S.AGALACTIAE)ISOLATED     Note: TESTING AGAINST S. AGALACTIAE NOT ROUTINELY PERFORMED DUE TO PREDICTABILITY OF AMP/PEN/VAN SUSCEPTIBILITY.     Performed at [facility]   Report  Status 10/13/2013 FINAL   Final     Labs: Basic Metabolic Panel:  Recent Labs Lab 10/08/13 0645 10/09/13 0635 10/11/13 0110 10/12/13 0510 10/13/13 0520  NA 140 138 140 138 135*  K 3.5* 3.8 4.6 3.7 4.3  CL 104 103 99 101 100  CO2 25 22 27 24 20   GLUCOSE 92 94 78 99 91  BUN 12 11 10 12 12   CREATININE 0.80 0.59 0.63 0.60 0.64  CALCIUM 8.8 8.7 9.8 8.8 9.1  MG  --   --  2.0  --   --    Liver Function Tests:  Recent Labs Lab 10/08/13 0645  AST 13  ALT 8  ALKPHOS 36*  BILITOT 0.3  PROT 6.3  ALBUMIN 3.2*   No results found for this basename: LIPASE, AMYLASE,  in the last 168 hours No results found for this basename: AMMONIA,  in the last 168 hours CBC:  Recent Labs Lab 10/07/13 1615 10/08/13 0645 10/09/13 0635 10/11/13 0110 10/12/13 0510  WBC 11.2* 8.3 8.8 11.9* 9.2  NEUTROABS  --  3.5  --  6.8  --   HGB 14.3 12.6 12.9 14.7 13.7  HCT 41.4 36.2 37.2 41.0 39.0  MCV 87.9 86.8 86.7 85.2 85.0  PLT 336 273 279 310 288   Cardiac Enzymes:  Recent Labs Lab 10/07/13 2239 10/08/13 0645 10/08/13 1220  TROPONINI <0.30 <0.30 <0.30   BNP: BNP (last 3 results) No results found for this basename: PROBNP,  in the last 8760 hours CBG: No results found for this basename: GLUCAP,  in the last 168 hours     Signed:  [facility] MD [facility] 10/14/2013, 2:42 PM

## 2013-10-14 NOTE — Evaluation (Addendum)
Physical Therapy Evaluation & Discharge Patient Details Name: Julie Doyle MRN: [id number] DOB: [birth year] Today's Date: 10/14/2013 Time: 0272-5366 PT Time Calculation (min): 40 min  PT Assessment / Plan / Recommendation History of Present Illness  Pt admitted with RLE pain.  She was dx with LLE DVT recently.  She also had sx to remove ovarian cyst approx 2 weeks ago. MD ordered PT eval for new onset LLE pain near ankle  Clinical Impression  Pt presents with tenderness to palpation over Left deltoid ligament and tibialis anterior tendon and decreased WB secondary to pain on LLE, however unable to reproduce symptoms with active/passive ROM. Strength/ROM within functional limits for Left. Pt states she feels better today than last night and does not need an assistive device for mobility. No skilled acute PT needed at this time as pt is independent with functional mobility and no goals will be set. Pt informed to seek outpatient physical therapy if symptoms persist. PT signing off.    PT Assessment  Patent does not need any further PT services    Follow Up Recommendations  No PT follow up (Suggested pt to seek Outpatient PT if symptoms persist)    Does the patient have the potential to tolerate intense rehabilitation      Barriers to Discharge        Equipment Recommendations  None recommended by PT    Recommendations for Other Services     Frequency      Precautions / Restrictions Restrictions Weight Bearing Restrictions: No   Pertinent Vitals/Pain "No pain while I'm in bed, only when I stand" Pt repositioned in bed for comfort.      Mobility  Bed Mobility Overal bed mobility: Independent Transfers Overall transfer level: Independent Ambulation/Gait Ambulation/Gait assistance: Modified independent (Device/Increase time) Ambulation Distance (Feet): 200 Feet Assistive device: None;Rolling walker (2 wheeled) Gait Pattern/deviations: Antalgic General Gait Details: Pt  wished to use RW at first for ambulation however after about 20 feet was able to amb without RW. Antalgic gait deviation however improved with distance, as patient stated she was feeling much better today than last night concerning her pain at L anteromedial ankle.    Exercises Other Exercises Other Exercises: Ankle AROM, (ABC's)  Other Exercises: Ankle PROM,   PT Diagnosis:    PT Problem List:   PT Treatment Interventions:       PT Goals(Current goals can be found in the care plan section) Acute Rehab PT Goals Patient Stated Goal: feel better PT Goal Formulation: No goals set, d/c therapy  Visit Information  Last PT Received On: 10/14/13 Assistance Needed: +1 History of Present Illness: Pt admitted with RLE pain.  She was dx with LLE DVT recently.  She also had sx to remove ovarian cyst approx 2 weeks ago. MD ordered PT eval for new onset LLE pain near ankle       Prior [place] expects to be discharged to:: Private residence Living Arrangements: Other relatives Available Help at Discharge: Family;Available 24 hours/day Type of Home: House Home Access: Stairs to enter [employer] of Steps: 2 Home Layout: One level Home Equipment: Crutches Additional Comments: Pt is unsure of extact location she we be after d/c as she is in the middle of a move to [place]. She will however have 24 hr supervision by her father's fiance. Prior Function Level of Independence: Independent Dominant Hand: Right    Cognition  Cognition Arousal/Alertness: Awake/alert Behavior During Therapy: WFL for tasks assessed/performed Overall Cognitive  Status: Within Functional Limits for tasks assessed    Extremity/Trunk Assessment Upper Extremity Assessment Upper Extremity Assessment: Overall WFL for tasks assessed Lower Extremity Assessment Lower Extremity Assessment: LLE deficits/detail LLE Deficits / Details: Pt with tenderness to palpation around  anterior portions of deltoid ligament and tibialis anterior tendon before insertion. ROM within functional limits, unable to reproduce pain with active/passive range of motion. Pt complains of pain with WB on LLE.   Balance Balance Overall balance assessment: Independent General Comments General comments (skin integrity, edema, etc.): No contusion or swelling noted in ankle. No hx of fall or other trauma  End of Session PT - End of Session Equipment Utilized During Treatment: Gait belt Activity Tolerance: Patient tolerated treatment well Patient left: in bed;with call bell/phone within reach Nurse Communication: Mobility status  GP    [place], [place]  Ellouise Newer 10/14/2013, 10:40 AM

## 2013-10-14 NOTE — Progress Notes (Signed)
Pt with c/o increased pain in left LE just above ankle and middle of leg anterior Says it also feels tight in the mid ant. area, states it started during the morning hours. Says it hurts to walk on it but pt can bear some wt on it in going to [place]. Pulses good, no swelling,warmth same in both legs. Advised NP o/c who stated pt already on appropriate interventions for any risk of DVT and no new orders at this time.  No complaints at 0600 this am.

## 2013-10-14 NOTE — Progress Notes (Signed)
Pharmacy Note-Anticoagulation  Pharmacy Consult :  28 y.o. female is currently on Coumadin with Lovenox bridging for LLE DVT.Marland Kitchen   Latest Labs : Hematology :  Recent Labs  10/12/13 0510 10/13/13 0520 10/14/13 0725  HGB 13.7  --   --   HCT 39.0  --   --   PLT 288  --   --   APTT 48*  --   --   LABPROT 21.3* 21.2* 21.5*  INR 1.91* 1.90* 1.93*  CREATININE 0.60 0.64  --     Lab Results  Component Value Date   INR 1.93* 10/14/2013   INR 1.90* 10/13/2013   INR 1.91* 10/12/2013        HGB 13.7 10/12/2013   HGB 14.7 10/11/2013   HGB 12.9 10/09/2013    Current Medication[s] Include:  Scheduled:  Scheduled:  . enoxaparin (LOVENOX) injection  150 mg Subcutaneous Q24H  . methylphenidate  54 mg Oral BH-[id number]  . prochlorperazine  10 mg Intravenous Once  . senna-docusate  1 tablet Oral BID  . sodium chloride  3 mL Intravenous Q12H  . Warfarin - Pharmacist Dosing Inpatient   Does not apply q1800    Assessment :  Today's INR is unchanged after Coumadin dose yesterday.   INR is 1.93.    Lovenox will be continued, as an outpatient, until INR therapeutic for 24 - 48 hours.  No bleeding complications observed.  Goal :  INR goal is 2-3    Lovenox bridging until INR therapeutic.  Plan : 1. Lovenox to be continued as outpatient pending INR results Thursday. 2. Coumadin 12.5 mg today Prior to discharge.  3. Next INR Thursday.  Chalyn Amescua, Craig Guess, Pharm.D. 10/14/2013  11:44 AM

## 2013-10-14 NOTE — Plan of Care (Signed)
Problem: Food- and Nutrition-Related Knowledge Deficit (NB-1.1) Goal: Nutrition education Formal process to instruct or train a patient/client in a skill or to impart knowledge to help patients/clients voluntarily manage or modify food choices and eating behavior to maintain or improve health. Outcome: Completed/Met Date Met:  10/14/13 Nutrition Education Note  RD consulted for nutrition education regarding a Coumadin and diet.  Pharmacy has already educated the patient, however the patient continues to have questions.   Pt verbalizes that she will often drink green smoothies and eat high vitamin K vegetables for Breakfast. It would appear reviewing pt's intake with her that she gets on "food kicks". Pt will d/c home with mom where her diet would be more normal. Pt states once she moves out on her own she would eat her typical foods/diet. A lot of pt's care seems to be up in the air, who will manage her INR levels, living with mom but hopes to move to [place].   Strongly encouraged pt to continue a lower vitamin K intake (this is what she has been consuming here and will go home on). We reviewed fruits and vegetables that are low in vitamin K that she can eat, explained that raw high vitamin K foods have less when raw and more when cooked. Encouraged pt to discuss with her outpatient MD and Pharmacist if she would prefer to increase the amount of vitamin K that she is consuming. She should be monitored so that she increases slowly and medications can be adjusted.  Provided pt with handout on vitamin K and coumadin from the Academy of [employer]. Teach back method used.    Expect fair compliance.  Body mass index is 30.54 kg/(m^2). Pt meets criteria for obesity class I based on current BMI.  Current diet order is Regular, patient is consuming approximately 100% of meals at this time. Labs and medications reviewed. No further nutrition interventions warranted at this time. RD contact  information provided. If additional nutrition issues arise, please re-consult RD.  [place], [place], [place] Pager [phone] After Hours Pager

## 2013-10-14 NOTE — Progress Notes (Signed)
Subjective:     Patient ID: Julie Doyle, female   DOB: 1985-08-16, 28 y.o.   MRN: [id number]  HPI Comments: Pt here 2w post-op after laparoscopic RSO for dermoid cyst.  Post-operative course complicated by admission for left peroneal DVT.  Pt was placed on Lovenox and transitioned to coumadin.  She was readmitted 3/14, discharged today on lovenox and coumaden.. Pt had +ANA 1:80 (speckled), negative ESR, RHF. Pt was noted to have these low grade temperatures also while in the hospital.       Review of Systems     Objective:   Physical Exam  Nursing note and vitals reviewed. Constitutional: She is oriented to person, place, and time. She appears well-developed and well-nourished.  Abdominal: Soft. She exhibits no distension. There is no tenderness. There is no rebound and no guarding.    Musculoskeletal:       Right lower leg: She exhibits tenderness. She exhibits no bony tenderness, no swelling and no edema.       Left lower leg: Normal.  Neurological: She is alert and oriented to person, place, and time.       Assessment:     Post-op RSO for for dermoid LLE DVT on anticoagulation     Plan:     Surgically doing well F/u with hematology-scheduled Consider referral to rheumatology  Watch cycles on a/c

## 2013-10-14 NOTE — Care Management Note (Signed)
:    10/14/13 4:22pm Ricki Miller, RN BSN Case manager Patient will go to her PCP on Thursday, 10/16/13 @ 12:30pm for PT/INR.

## 2013-10-15 ENCOUNTER — Telehealth: Payer: Self-pay | Admitting: *Deleted

## 2013-10-15 NOTE — Telephone Encounter (Signed)
Call Julie Doyle she had questions about some lab work and being referred to a Rheumatologist  [phone]

## 2013-10-16 ENCOUNTER — Encounter: Payer: Self-pay | Admitting: Internal Medicine

## 2013-10-16 ENCOUNTER — Telehealth: Payer: Self-pay | Admitting: *Deleted

## 2013-10-16 ENCOUNTER — Ambulatory Visit (INDEPENDENT_AMBULATORY_CARE_PROVIDER_SITE_OTHER): Payer: BC Managed Care – PPO | Admitting: Internal Medicine

## 2013-10-16 VITALS — BP 104/64 | HR 79 | Temp 99.1°F | Resp 18 | Wt 189.0 lb

## 2013-10-16 DIAGNOSIS — F418 Other specified anxiety disorders: Secondary | ICD-10-CM

## 2013-10-16 DIAGNOSIS — D6859 Other primary thrombophilia: Secondary | ICD-10-CM

## 2013-10-16 DIAGNOSIS — I82409 Acute embolism and thrombosis of unspecified deep veins of unspecified lower extremity: Secondary | ICD-10-CM

## 2013-10-16 DIAGNOSIS — F411 Generalized anxiety disorder: Secondary | ICD-10-CM

## 2013-10-16 DIAGNOSIS — D6851 Activated protein C resistance: Secondary | ICD-10-CM

## 2013-10-16 DIAGNOSIS — R3989 Other symptoms and signs involving the genitourinary system: Secondary | ICD-10-CM

## 2013-10-16 DIAGNOSIS — R399 Unspecified symptoms and signs involving the genitourinary system: Secondary | ICD-10-CM

## 2013-10-16 DIAGNOSIS — R768 Other specified abnormal immunological findings in serum: Secondary | ICD-10-CM

## 2013-10-16 DIAGNOSIS — R894 Abnormal immunological findings in specimens from other organs, systems and tissues: Secondary | ICD-10-CM

## 2013-10-16 DIAGNOSIS — D72829 Elevated white blood cell count, unspecified: Secondary | ICD-10-CM

## 2013-10-16 LAB — POCT URINALYSIS DIPSTICK
Bilirubin, UA: NEGATIVE
Blood, UA: NEGATIVE
GLUCOSE UA: NEGATIVE
Ketones, UA: POSITIVE
LEUKOCYTES UA: NEGATIVE
NITRITE UA: NEGATIVE
Protein, UA: NEGATIVE
Spec Grav, UA: >=1.03
UROBILINOGEN UA: NEGATIVE
pH, UA: 5

## 2013-10-16 LAB — PROTIME-INR
INR: 2.57 — AB (ref <1.50)
Prothrombin Time: 26.9 seconds — ABNORMAL HIGH (ref 11.6–15.2)

## 2013-10-16 NOTE — Progress Notes (Signed)
Subjective:    Patient ID: Julie Doyle, female    DOB: [birth year], 28 y.o.   MRN: [id number]  HPI  Julie Doyle is here for hospital follow up.  She is with Doran Stabler close family friend.    Julie Doyle had  Left leg peroneal DVT 9 days after laparoscopic Right salpingo- oopherectomy for dermoid cyst.   Had some dyspnea with low probability VQ and  was admitted 3/10 to 3/12 .  She is heterozygous for Factor V Leiden  .    Was re-admitted 3/14/-3/17 with R leg pain but work up neg for DVT.  Discharge  INR 1.93 .  Julie Doyle discharged on coumadin with Lovenox bridging.    Last dose of Coumadin was 12.5 mg.   Julie Doyle had hematology consult during hospitalization where all treatment options were discussed  And Julie Doyle chose to continue Coumadin for now.  She had been on Coumadin in the past for peri-operative prophylaxis.    She is fully aware of all side effects and bleeding risks of Coumadin treament  She did have her 2 weeks f/U with GYN on 3/17 and felt to be doing well  See hospital discharge of 3/17.  Slight leukocytosis that resolved and felt to be reactive.  She did have weakly positive ANA as an outpt 1:80 speckled pattern  Doing well has some urinary urgency.   NO pain in either leg now.  NO chest tightness or SOB  Julie Doyle reports she is moving to [place] in 2 days. Ongoing conflict with biologic mother causing ongoing situational stress for Julie Doyle.    Julie Doyle had this move planned for some time.  Father is in [place] and his significant other who is very close to [place] is with Julie Doyle. today.      I spoke with [facility] the referral co-ordinator with [facility] in [place] who will be accepting Julie Doyle early next week.    Julie Doyle is doing well - ongoing situational stress but Collie Siad is very helpful to Julie Doyle.  And is assisting with re-location  Review of Systems See HPI    Objective:   Physical Exam Allergies  Allergen Reactions  . Ivp Dye [Iodinated Diagnostic Agents] Hives and Shortness Of Breath   Past  Medical History  Diagnosis Date  . Factor 5 Leiden mutation, heterozygous   . Anxiety   . Clotting disorder   . Dysmenorrhea   . HSV-1 infection   . Blood dyscrasia     factor V  . Ovarian cyst    Past Surgical History  Procedure Laterality Date  . Orif of right fib  2011    Open Reduction of the right fibula  . Appendectomy  1998  . Wisdom tooth extraction  2012  . Cystectomy Right 01/07/2013    Laparoscopic  . Laparoscopy Right 01/07/2013    Procedure: LAPAROSCOPY OPERATIVE;  Surgeon: Azalia Bilis, MD;  Location: [facility];  Service: Gynecology;  Laterality: Right;  ovarian cystectomy  . Intrauterine device (iud) insertion N/A 01/07/2013    Procedure: INTRAUTERINE DEVICE (IUD) INSERTION;  Surgeon: Azalia Bilis, MD;  Location: [facility];  Service: Gynecology;  Laterality: N/A;  . Laparoscopic ovarian cystectomy Right 09/28/2013    Procedure: LAPAROSCOPIC RIGHT OOPHORECTOMY;  Surgeon: Azalia Bilis, MD;  Location: [facility];  Service: Gynecology;  Laterality: Right;   History   Social History  . Marital Status: Single    Spouse Name: N/A    Number of Children: N/A  . Years of  Education: N/A   Occupational History  . Not on file.   Social History Main Topics  . Smoking status: Never Smoker   . Smokeless tobacco: Never Used  . Alcohol Use: Yes     Comment: socially   . Drug Use: No  . Sexual Activity: Yes    Birth Control/ Protection: Condom   Other Topics Concern  . Not on file   Social History Narrative  . No narrative on file   Family History  Problem Relation Age of Onset  . Hypertension Mother   . Clotting disorder Mother     DVT  . Cancer Maternal Grandmother     pancreatic  . Diabetes Maternal Grandmother   . Pancreatic cancer Maternal Grandmother   . Cancer Paternal Grandmother   . Breast cancer Paternal Grandmother   . Lung cancer Paternal Grandfather    Patient Active Problem List   Diagnosis Date Noted  . Right leg pain 10/11/2013  . Chest  tightness 10/11/2013  . Leukocytosis, unspecified 10/08/2013  . DVT (deep venous thrombosis) 10/07/2013  . Pelvic mass in female 09/27/2013  . 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 Anxiety state, unspecified 11/12/2012  . Reactive depression (situational) 11/12/2012  . Dysmenorrhea 11/12/2012  . Insomnia 11/12/2012  . TMJ (dislocation of temporomandibular joint) 11/12/2012  . Factor 5 Leiden mutation, heterozygous    Current Outpatient Prescriptions on File Prior to Visit  Medication Sig Dispense Refill  . enoxaparin (LOVENOX) 150 MG/ML injection Inject 0.87 mLs (130 mg total) into the skin daily. START TONIGHT AT 2200 HOURS X 5 DAYS  5 Syringe  0  . LORazepam (ATIVAN) 1 MG tablet Take 1 tablet (1 mg total) by mouth every 8 (eight) hours as needed for anxiety.  10 tablet  0  . methylphenidate (CONCERTA) 36 MG CR tablet Take 1 tablet (36 mg total) by mouth every morning.  30 tablet  0  . Naphazoline-Pheniramine (EYE ALLERGY RELIEF OP) Apply 1-2 drops to eye daily as needed (for allergies/irritation).       Marland Kitchen oxyCODONE 10 MG TABS Take 1 tablet (10 mg total) by mouth every 4 (four) hours as needed for moderate pain.  20 tablet  0  . warfarin (COUMADIN) 5 MG tablet Take 2 tablets (10 mg total) by mouth one time only at 6 PM.  60 tablet  0  . BIOTIN PO Take 1 tablet by mouth daily.       No current facility-administered medications on file prior to visit.        Assessment & Plan:   Acute L peroneal DVT:  Will get INR today and further management based on results.  Again discussed bleeding risks with Coumadin and advised if any bleeding gums, heavy periods ,  Blood is stool ,  Headache, Julie Doyle is to go to ER or urgent care or call new hematology group if she is established there.  I have spoken and faxed all info to [facility] in [place] and Julie Doyle will be seen early next week there.   I have given Julie Doyle number to referral co-ordinator for hematology group and she will call today.     Urinary  urgency  U/A neg in office today  Weakly positive ANA 1:80  Discussed with Julie Doyle and in setting of DVT I recommended repeat testing .  May also get referral to Rheumatology from her new Hematologist or primary care MD.  Subjective  Low grade temp  Etiology unclear.   She did have temp  of 100 on 3/17 at post op visit with GYN  No obvious infectious source at this time.    U/A and CXR negative.   Mild leukocytosis during hospitalization felt to be reactive  Most outpatient recordings all below 100.  Will need further work up when established with new MD.    Situational stress on Ativan prn.   Heterozygous Factor 5 Leiden mutation  Recent  RSO had follow up with GYN on 3/17  Addendum approx 1900 3/19  Spoke with Julie Doyle.  INR 2.57  OK to stop Lovenox.  Advised to take 7.5 mg Coumadin each evening for next 2 nights and 10 mg on Sat and Sunday.  Repeat INR at new hematology office or at Urgent care in [place].  Julie Doyle voices understanding

## 2013-10-16 NOTE — Telephone Encounter (Signed)
Pt states that she was written a RX for Concerta while in the hospital and during the process of moving she has misplaced it. States that she has been trying to contact the physician  that admitted her but she has been unable to contact him, she has a MD in [place] that she has called but cannot get in to see her for a while. Advised pt to call her psychiatrist office and explain her situation, and perhaps they would refill enough until appt.She would like to know if Dr Coralyn Mark could rewrite the RX. Explained that this is not a medication that Dr Coralyn Mark likes to prescribe but I will send her the message

## 2013-10-18 NOTE — Telephone Encounter (Signed)
I do not prescribe ADD meds

## 2013-10-19 NOTE — Patient Instructions (Addendum)
Pt give number to [facility] in [place] - to cal referral line and speak to [facility]  To get Pt/INR on Monday  Any bleeding as discussed in office , to go to ER or urgent care

## 2013-10-20 ENCOUNTER — Telehealth: Payer: Self-pay | Admitting: *Deleted

## 2013-10-20 DIAGNOSIS — Z7901 Long term (current) use of anticoagulants: Secondary | ICD-10-CM

## 2013-10-20 NOTE — Telephone Encounter (Signed)
Pt is in [place] and would like to have PT and INR checked at the [facility]. Will fax an order to the clinic with a request that results be sent to [place]

## 2013-10-21 ENCOUNTER — Telehealth: Payer: Self-pay | Admitting: Internal Medicine

## 2013-10-21 ENCOUNTER — Telehealth: Payer: Self-pay | Admitting: *Deleted

## 2013-10-21 NOTE — Telephone Encounter (Signed)
Spoke with pt .  She had appt with [facility] yesterday 3/23 but pt did not have a car and did not get alternative transportation  INR done 3/23  (2.86)  Pt advised to reschedule her appointment and be sure to get it not see hematologist this week.  ADvised to take 7.5 mg Coumadin Mon-FRi and 10 mg on Saturday and Sunday until seen at the new office  Also advised to see new primary MD.    If any problems with appt she is to call my office.

## 2013-10-21 NOTE — Telephone Encounter (Signed)
error 

## 2013-10-24 NOTE — Telephone Encounter (Signed)
Order for PT and INR sent to clinic

## 2013-10-27 ENCOUNTER — Telehealth: Payer: Self-pay | Admitting: *Deleted

## 2013-10-27 NOTE — Telephone Encounter (Signed)
Julie Doyle called wanting to know the expected time frame that her legs would continue to hurt.  She is still experiencing a great deal of pain in her legs. She said she is now limping around.

## 2013-11-14 ENCOUNTER — Telehealth: Payer: Self-pay | Admitting: Gynecology

## 2013-11-14 NOTE — Telephone Encounter (Signed)
Patient calling requesting an RX for Warfarin (generic for coumadin). Dr. Charlies Constable has not written this for her before. Dosing Schedule: 5 mg Monday-Friday, 10 mg Sat-Sun. 60 tablets per month  [facility] on [address], [place]

## 2013-11-14 NOTE — Telephone Encounter (Signed)
Spoke to patient to advise that this practice does not monitor her coumadin therapy so Dr. Charlies Constable would not be able to provide her with this refill as requested. Suggested she follow up with her pcp, Dr. Coralyn Mark or hematologist as per [facility], pcp was last to give orders for her warfarin. Patient is agreeable and will contact pcp.  Discussed with Dr. Quincy Simmonds, covering for Dr. Charlies Constable who agrees with advise.   Routing to provider for final review. Patient agreeable to disposition. Will close encounter   cc Dr. Charlies Constable

## 2013-11-17 ENCOUNTER — Other Ambulatory Visit: Payer: Self-pay | Admitting: *Deleted

## 2013-11-17 ENCOUNTER — Telehealth: Payer: Self-pay | Admitting: *Deleted

## 2013-11-17 NOTE — Telephone Encounter (Signed)
Pt states that she is running out of her coumadin and needs a refill advised pt that she will need to have her medication refilled by her hematologist in [place].

## 2013-11-17 NOTE — Telephone Encounter (Signed)
error 

## 2013-12-04 ENCOUNTER — Ambulatory Visit (INDEPENDENT_AMBULATORY_CARE_PROVIDER_SITE_OTHER): Payer: BC Managed Care – PPO

## 2013-12-04 ENCOUNTER — Telehealth: Payer: Self-pay | Admitting: Gynecology

## 2013-12-04 ENCOUNTER — Encounter: Payer: Self-pay | Admitting: Obstetrics and Gynecology

## 2013-12-04 ENCOUNTER — Ambulatory Visit (INDEPENDENT_AMBULATORY_CARE_PROVIDER_SITE_OTHER): Payer: BC Managed Care – PPO | Admitting: Obstetrics and Gynecology

## 2013-12-04 ENCOUNTER — Other Ambulatory Visit: Payer: Self-pay | Admitting: *Deleted

## 2013-12-04 ENCOUNTER — Ambulatory Visit: Payer: BC Managed Care – PPO | Admitting: Obstetrics and Gynecology

## 2013-12-04 VITALS — BP 120/68 | HR 84 | Ht 66.0 in | Wt 187.0 lb

## 2013-12-04 DIAGNOSIS — M25559 Pain in unspecified hip: Secondary | ICD-10-CM

## 2013-12-04 DIAGNOSIS — R1031 Right lower quadrant pain: Secondary | ICD-10-CM

## 2013-12-04 DIAGNOSIS — Z86018 Personal history of other benign neoplasm: Secondary | ICD-10-CM

## 2013-12-04 DIAGNOSIS — Z9889 Other specified postprocedural states: Secondary | ICD-10-CM

## 2013-12-04 LAB — POCT URINE PREGNANCY: Preg Test, Ur: NEGATIVE

## 2013-12-04 LAB — POCT URINALYSIS DIPSTICK
Bilirubin, UA: NEGATIVE
Blood, UA: NEGATIVE
GLUCOSE UA: NEGATIVE
LEUKOCYTES UA: NEGATIVE
Nitrite, UA: NEGATIVE
Protein, UA: NEGATIVE
Urobilinogen, UA: NEGATIVE
pH, UA: 5

## 2013-12-04 NOTE — Telephone Encounter (Signed)
Patient calling reporting problem with pelvic pain. She is available to come for a visit next Tuesday, 06/11/14, or requests a referral to a specialist who can assist her in the [place] area where she now lives.

## 2013-12-04 NOTE — Progress Notes (Signed)
GYNECOLOGY  VISIT   HPI: 28 y.o.   Single  Caucasian  female   [id number] with No LMP recorded.   here for   RLQ pain.   Patient worried about ovarian cyst formation.   Status that her pain today is her typical pain that she has had for 2 years.    In the past when this pain before, she was diagnosed with an ovarian cyst.    Status post laparoscopic right salpingo-oophorectomy on 09/29/13 for a mature dermoid cyst with post op left peroneal DVT 9 days post op. Treated with Lovenox and then Coumadin, which she is currently still on. Oncology at [facility] is managing this.  States her anticoagulation is at a good level. Has appointment with Oncology again next week.  Patient states she has pain whenever she is using her laptop on a desk. Has hot weak feeling, stabbing pain.  Radiates to the right hip. Unable to sleep last night.  Temp to 100.3 degrees at home.  (States she is having a fever for 3 months.) Not being around the computer helps the pain.  States that she recently returned to work following her surgery and the DVT.  (Works as a Insurance underwriter.) Tried a lead apron to reduce the pain - no help.  Tylenol - no help.  Cannot take ibuprofen.    Bowel movements normal. Had three BMs today.  Slow to void and can void often. No dysuria.   Does not have appendix.   Has had Skyla since June or July 2013.  Happy with the IUD.  Had terrible dysmenorrhea prior to receiving this.  GC/CT - neg/neg - July 2014. Uncertain if she has had a new partner since July 2014.   GYNECOLOGIC HISTORY: No LMP recorded. Contraception:   Skyla IUD - cannot remember last menses, thinks it was in the last month, just not sure what day.         OB History   Grav Para Term Preterm Abortions TAB SAB Ect Mult Living   0 0 0 0 0 0 0 0 0 0          Patient Active Problem List   Diagnosis Date Noted  . Right leg pain 10/11/2013  . Chest tightness 10/11/2013  . Leukocytosis, unspecified 10/08/2013   . DVT (deep venous thrombosis) 10/07/2013  . Pelvic mass in female 09/27/2013  . 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 Anxiety state, unspecified 11/12/2012  . Reactive depression (situational) 11/12/2012  . Dysmenorrhea 11/12/2012  . Insomnia 11/12/2012  . TMJ (dislocation of temporomandibular joint) 11/12/2012  . Factor 5 Leiden mutation, heterozygous     Past Medical History  Diagnosis Date  . Factor 5 Leiden mutation, heterozygous   . Anxiety   . Clotting disorder   . Dysmenorrhea   . HSV-1 infection   . Blood dyscrasia     factor V  . Ovarian cyst   . DVT of leg (deep venous thrombosis)     left    Past Surgical History  Procedure Laterality Date  . Orif of right fib  2011    Open Reduction of the right fibula  . Appendectomy  1998  . Wisdom tooth extraction  2012  . Cystectomy Right 01/07/2013    Laparoscopic  . Laparoscopy Right 01/07/2013    Procedure: LAPAROSCOPY OPERATIVE;  Surgeon: Azalia Bilis, MD;  Location: [facility];  Service: Gynecology;  Laterality: Right;  ovarian cystectomy  . Intrauterine device (iud) insertion N/A 01/07/2013  Procedure: INTRAUTERINE DEVICE (IUD) INSERTION;  Surgeon: Azalia Bilis, MD;  Location: [facility];  Service: Gynecology;  Laterality: N/A;  . Laparoscopic ovarian cystectomy Right 09/28/2013    Procedure: LAPAROSCOPIC RIGHT OOPHORECTOMY;  Surgeon: Azalia Bilis, MD;  Location: [facility];  Service: Gynecology;  Laterality: Right;    Current Outpatient Prescriptions  Medication Sig Dispense Refill  . BIOTIN PO Take 1 tablet by mouth daily.      . methylphenidate (CONCERTA) 36 MG CR tablet Take 1 tablet (36 mg total) by mouth every morning.  30 tablet  0  . Naphazoline-Pheniramine (EYE ALLERGY RELIEF OP) Apply 1-2 drops to eye daily as needed (for allergies/irritation).       . warfarin (COUMADIN) 5 MG tablet Take 2 tablets (10 mg total) by mouth one time only at 6 PM.  60 tablet  0   No current facility-administered medications for  this visit.     ALLERGIES: Ivp dye  Family History  Problem Relation Age of Onset  . Hypertension Mother   . Clotting disorder Mother     DVT  . Cancer Maternal Grandmother     pancreatic  . Diabetes Maternal Grandmother   . Pancreatic cancer Maternal Grandmother   . Cancer Paternal Grandmother   . Breast cancer Paternal Grandmother   . Lung cancer Paternal Grandfather     History   Social History  . Marital Status: Single    Spouse Name: N/A    Number of Children: N/A  . Years of Education: N/A   Occupational History  . Not on file.   Social History Main Topics  . Smoking status: Never Smoker   . Smokeless tobacco: Never Used  . Alcohol Use: Yes     Comment: socially   . Drug Use: No  . Sexual Activity: Not Currently    Birth Control/ Protection: Condom   Other Topics Concern  . Not on file   Social History Narrative  . No narrative on file    ROS:  Pertinent items are noted in HPI.  PHYSICAL EXAMINATION:    BP 120/68  Pulse 84  Ht 5\' 6"  (1.676 m)  Wt 187 lb (84.823 kg)  BMI 30.20 kg/m2     General appearance: alert, cooperative and appears stated age, patient leaning over periodically when feels discomfort. Lungs: clear to auscultation bilaterally Heart: regular rate and rhythm Abdomen: soft, non-tender; no masses,  no organomegaly.  Mild tenderness in the right lower quadrant with no guarding or rebound.  No abnormal inguinal nodes palpated  Pelvic: External genitalia:  no lesions              Urethra:  normal appearing urethra with no masses, tenderness or lesions              Bartholins and Skenes: normal                 Vagina: normal appearing vagina with normal color and discharge, no lesions              Cervix: normal appearance.  IUD strings not seen, but were felt on exam.  No CMT.                   Bimanual Exam:  Uterus:  uterus is normal size, shape, consistency and nontender  Adnexa: normal adnexa  in size, nontender and no masses                                     Pelvic ultrasound - normal uterus, IUD in place.  Small left CL cyst.  No free fluid.  Absent right adnexa. Right kidney ultrasound - no dilation.   ASSESSMENT  RLQ pain - appears to be colicky.  I suspect potential bowel as a source. No acute abdomen today.  Status post laparoscopic right salpingo-oophorectomy. History of prior appendectomy. Left leg DVT, on anticoagulation. Skyla IUD patient.  Negative UPT. Negative UA.  PLAN  GC/CT. I recommend GI consultation.  Patient will decide if she wants to see someone in [place] or in [place]. She will discuss with her oncologist if she decides to pursue evaluation in [place]. May try heat tonight.    For worsening pain, to the emergency department.    An After Visit Summary was printed and given to the patient.  _45_____ minutes face to face time of which over 50% was spent in counseling.

## 2013-12-04 NOTE — Patient Instructions (Signed)
Go to the emergency department if your pain increases.  Call our office if you need a referral to a gastroenterologist in [place].

## 2013-12-04 NOTE — Telephone Encounter (Signed)
Patient calling back to office. She states she is driving and can be en route to our office. Advised patient that we can see her but patient wants to know "I need to know 100% if I can have an ultrasound or not if I come." I advised patient that I could not tell her that she would be getting an ultrasound if she comes to our office and that she must be assessed first by a provider who is able to order that test. Patient very insistent that she know if she can have an ultrasound and asked Lamont Snowball, RN to speak with patient as patient is not agreeable to disposition that was offered to her.

## 2013-12-04 NOTE — Telephone Encounter (Signed)
Called picked up to assist patient regarding PUS.  Confirmed that patien needs assessment prior to ordering PUS.  RLQ pain for approx 1 month. Associated with laptop uese. Increasing worse last night and today. Unresponsive to Tylenol. Cant take Motrin. Pain scale 6/10. Pain is intermittent and just like previous cyst. Does not have right overy or appendix. Unable to sleep. Repeats "hurts so bad". Temp 100 taken after I asked her Advised really difficult to assess over phone, work in [place] at [employer] today with Dr Quincy Simmonds on call.  Routing to provider for final review. Patient agreeable to disposition. Will close encounter  CC Dr Charlies Constable, just FYI (out of office).

## 2013-12-04 NOTE — Telephone Encounter (Signed)
Spoke with patient. Patient states "I am having pain on my right side when I am around my laptop. I have had a cyst before and it feels like it did then. I know some people do not think that a laptop could cause this but I do since I have lost one of my ovaries before. The closer I sit to my laptop the worse it is." Rates pain and a 4/10 constantly and last night when it began it was a 6 1/2 / 10. Denies nausea, vomiting, fever, urinary symptoms, bowel symptoms, and discharge. Has been taking tylenol for pain relief. Advised would like patient to come in to be seen by a provider as today is Dr.Lathrop's day off for evaluation. Patient states "I would rather be seen on a day when you have ultrasound available. If I don't then I could have to make two trips there and I live in [place] now." Advised patient we recommend that she come in to be seen today for evaluation and not to wait over the weekend to be seen. Patient states she does not know if she can come due to work but will speak with her boss to ask. Patient requesting a call back around 1:30pm. Advised will call patient back at 1:30pm. Advised if symptoms worsen or anything changes she will need to seek Urgent Care in her area. Patient agreeable.

## 2013-12-06 LAB — GC/CHLAMYDIA PROBE AMP, URINE
CHLAMYDIA, SWAB/URINE, PCR: NEGATIVE
GC Probe Amp, Urine: NEGATIVE

## 2014-01-07 ENCOUNTER — Telehealth: Payer: Self-pay | Admitting: *Deleted

## 2014-01-07 ENCOUNTER — Ambulatory Visit (HOSPITAL_COMMUNITY): Payer: BC Managed Care – PPO

## 2014-01-07 NOTE — Telephone Encounter (Signed)
Pt called to cancel her appts here for [place] today and Dr. Alvy Bimler tomorrow.  States she has moved to [place] and has Hematologist in [place] now.  Appts canceled.

## 2014-01-08 ENCOUNTER — Ambulatory Visit: Payer: BC Managed Care – PPO

## 2014-01-08 ENCOUNTER — Inpatient Hospital Stay: Payer: BC Managed Care – PPO | Admitting: Hematology and Oncology

## 2014-08-18 ENCOUNTER — Ambulatory Visit (INDEPENDENT_AMBULATORY_CARE_PROVIDER_SITE_OTHER): Payer: BLUE CROSS/BLUE SHIELD | Admitting: Internal Medicine

## 2014-08-18 ENCOUNTER — Encounter: Payer: Self-pay | Admitting: Internal Medicine

## 2014-08-18 VITALS — BP 122/73 | HR 103 | Temp 98.9°F | Resp 16 | Ht 66.0 in | Wt 182.0 lb

## 2014-08-18 DIAGNOSIS — Z01812 Encounter for preprocedural laboratory examination: Secondary | ICD-10-CM | POA: Diagnosis not present

## 2014-08-18 DIAGNOSIS — R110 Nausea: Secondary | ICD-10-CM

## 2014-08-18 DIAGNOSIS — R1011 Right upper quadrant pain: Secondary | ICD-10-CM | POA: Diagnosis not present

## 2014-08-18 DIAGNOSIS — Z3202 Encounter for pregnancy test, result negative: Secondary | ICD-10-CM | POA: Diagnosis not present

## 2014-08-18 LAB — POCT URINALYSIS DIPSTICK
Bilirubin, UA: NEGATIVE
Blood, UA: NEGATIVE
GLUCOSE UA: NEGATIVE
Leukocytes, UA: NEGATIVE
NITRITE UA: NEGATIVE
PROTEIN UA: NEGATIVE
Urobilinogen, UA: NEGATIVE
pH, UA: 5

## 2014-08-18 LAB — POCT URINE PREGNANCY: Preg Test, Ur: NEGATIVE

## 2014-08-18 MED ORDER — PROMETHAZINE HCL 12.5 MG PO TABS: ORAL_TABLET | ORAL | Status: DC

## 2014-08-18 NOTE — Progress Notes (Signed)
Subjective:    Patient ID: Julie Doyle, female    DOB: 02/04/1986, 29 y.o.   MRN: [id number]  HPI Julie Doyle is here for acute visit and second opinion for  ER follow up.  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  She is dating and tells me that she wants to move to [facility] with her boyfriend.   She has been seen on 1/9 and 1/4 at [facility] for RUQ abd pain.  Pain began 3 weeks ago just below R side of ribs and radiates to umbilicus. No heartburn,  Some nausea today ,  Some diarrhea today  No fever.  No vaginal discharge no dysuria.   She is sexually active in monogamous relationship Has IUD.  She tells me she has upcoming appt with her primary care MD in [facility] and has an appt with GI MD later this month.    REview of records from [place]  CBC, Chem-12, lipase and U/A normal  Urine preg neg.  CT x2 noncontrast (pt states she did have oral dye - she is allergic to IV dye) shows no acute process  RUQ ultrasound also showed no acute process.    No concern for adhesions given prior GYN surgery for ovarian cyst   She will be driving back to [place] today     Allergies  Allergen Reactions  . Ivp Dye [Iodinated Diagnostic Agents] Hives and Shortness Of Breath   Past Medical History  Diagnosis Date  . Factor 5 Leiden mutation, heterozygous   . Anxiety   . Clotting disorder   . Dysmenorrhea   . HSV-1 infection   . Blood dyscrasia     factor V  . Ovarian cyst   . DVT of leg (deep venous thrombosis)     left   Past Surgical History  Procedure Laterality Date  . Orif of right fib  2011    Open Reduction of the right fibula  . Appendectomy  1998  . Wisdom tooth extraction  2012  . Cystectomy Right 01/07/2013    Laparoscopic  . Laparoscopy Right 01/07/2013    Procedure: LAPAROSCOPY OPERATIVE;  Surgeon: Azalia Bilis, MD;  Location: [facility];  Service: Gynecology;  Laterality: Right;  ovarian cystectomy  . Intrauterine device (iud)  insertion N/A 01/07/2013    Procedure: INTRAUTERINE DEVICE (IUD) INSERTION;  Surgeon: Azalia Bilis, MD;  Location: [facility];  Service: Gynecology;  Laterality: N/A;  . Laparoscopic ovarian cystectomy Right 09/28/2013    Procedure: LAPAROSCOPIC RIGHT OOPHORECTOMY;  Surgeon: Azalia Bilis, MD;  Location: [facility];  Service: Gynecology;  Laterality: Right;   History   Social History  . Marital Status: Single    Spouse Name: N/A    Number of Children: N/A  . Years of Education: N/A   Occupational History  . Not on file.   Social History Main Topics  . Smoking status: Never Smoker   . Smokeless tobacco: Never Used  . Alcohol Use: Yes     Comment: socially   . Drug Use: No  . Sexual Activity: Not Currently    Birth Control/ Protection: Condom   Other Topics Concern  . Not on file   Social History Narrative  . No narrative on file   Family History  Problem Relation Age of Onset  . Hypertension Mother   . Clotting disorder Mother     DVT  . Cancer Maternal  Grandmother     pancreatic  . Diabetes Maternal Grandmother   . Pancreatic cancer Maternal Grandmother   . Cancer Paternal Grandmother   . Breast cancer Paternal Grandmother   . Lung cancer Paternal Grandfather    Patient Active Problem List   Diagnosis Date Noted  . Right leg pain 10/11/2013  . Chest tightness 10/11/2013  . Leukocytosis, unspecified 10/08/2013  . DVT (deep venous thrombosis) 10/07/2013  . Pelvic mass in female 09/27/2013  . 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 Anxiety state, unspecified 11/12/2012  . Reactive depression (situational) 11/12/2012  . Dysmenorrhea 11/12/2012  . Insomnia 11/12/2012  . TMJ (dislocation of temporomandibular joint) 11/12/2012  . Factor 5 Leiden mutation, heterozygous    Current Outpatient Prescriptions on File Prior to Visit  Medication Sig Dispense Refill  . BIOTIN PO Take 1 tablet by mouth daily.    . methylphenidate (CONCERTA) 36 MG CR tablet Take 1 tablet (36 mg total)  by mouth every morning. 30 tablet 0  . Naphazoline-Pheniramine (EYE ALLERGY RELIEF OP) Apply 1-2 drops to eye daily as needed (for allergies/irritation).     . warfarin (COUMADIN) 5 MG tablet Take 2 tablets (10 mg total) by mouth one time only at 6 PM. 60 tablet 0   No current facility-administered medications on file prior to visit.       Review of Systems See HPI    Objective:   Physical Exam  Physical Exam  Nursing note and vitals reviewed.  Constitutional: She is oriented to person, place, and time. She appears well-developed and well-nourished.  HENT:  Head: Normocephalic and atraumatic.  Cardiovascular: Normal rate and regular rhythm. Exam reveals no gallop and no friction rub.  No murmur heard.  Pulmonary/Chest: Breath sounds normal. She has no wheezes. She has no rales.  ABD:  BS + no HSM.  Facial grimacing when palpating RUQ no rebound no referred rebound no peritoneal signs. Neurological: She is alert and oriented to person, place, and time.  Skin: Skin is warm and dry.  Psychiatric: She has a normal mood and affect. Her behavior is normal.        Assessment & Plan:  RUQ abd pain:  U/A today neg with neg pregnancy .   Etiology unclear.  She does not appear to have a surgical abd.  ADvised to keep appt with primary care in am and may need furhter imaging with MRI.  Also encouraged to keep appt with GI later this month  .  Pt allergic to IV contrast.   IUD placement appers OK on CT.    Advised if any worsening to go to nearest ED.    Nause  Ok for low dose Phenergan 12.5 mg  Advised t To take first dose when she is not driving as she is planning to return to [place] today

## 2014-08-19 ENCOUNTER — Encounter: Payer: Self-pay | Admitting: *Deleted

## 2015-02-06 IMAGING — US US TRANSVAGINAL NON-OB
1 series · 13 of 25 positions shown · non-contrast
Comparison: None

CLINICAL DATA: Dysmenorrhea with each cycle.  Prior appendectomy.
Factor V Leiden mutation.



[Series 1: us transvaginal non-ob · 0.32mm/px · 13 of 68 slices shown]
[im 1/68]
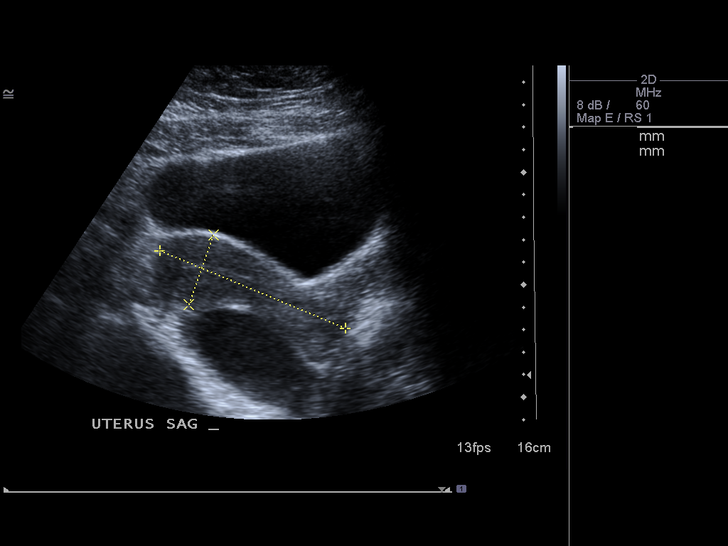
[im 6/68]
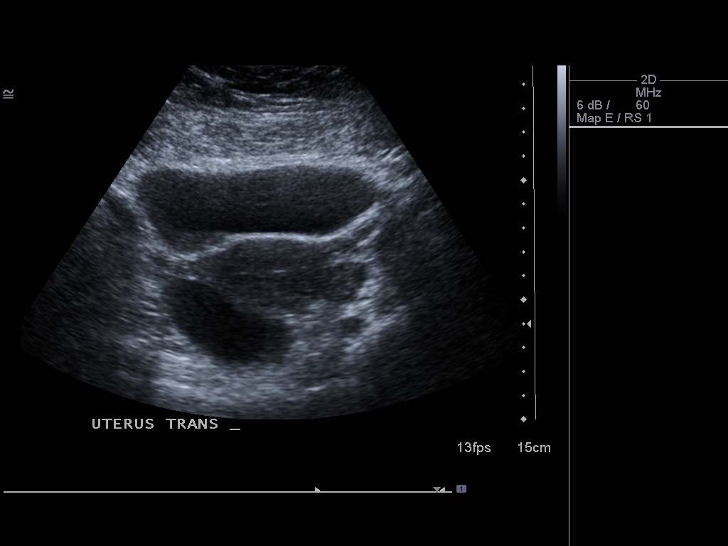
[im 12/68]
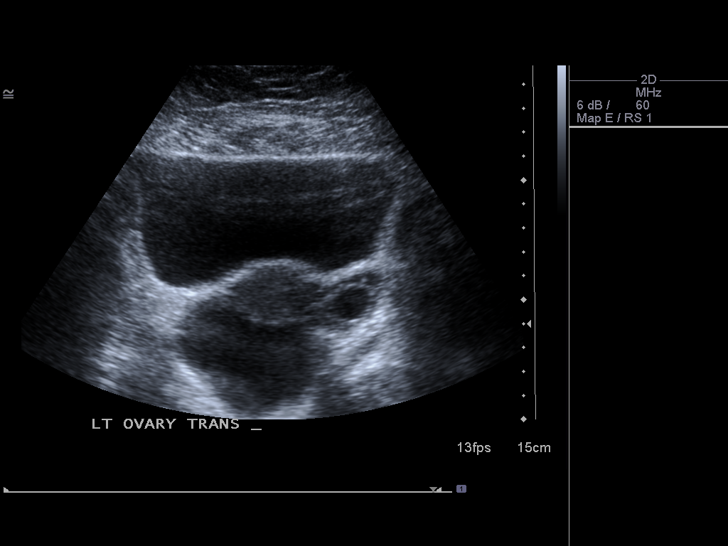
[im 17/68]
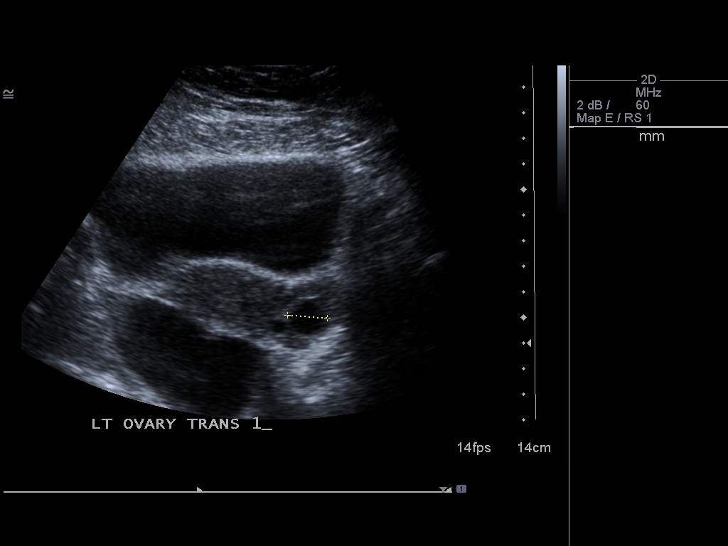
[im 23/68]
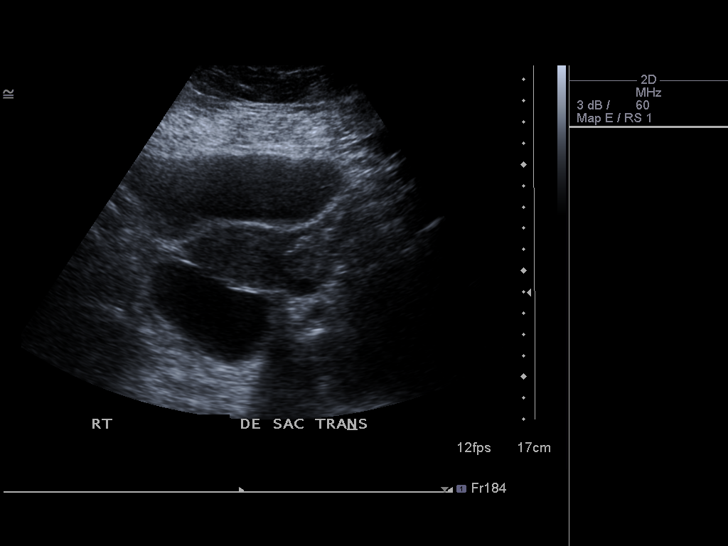
[im 28/68]
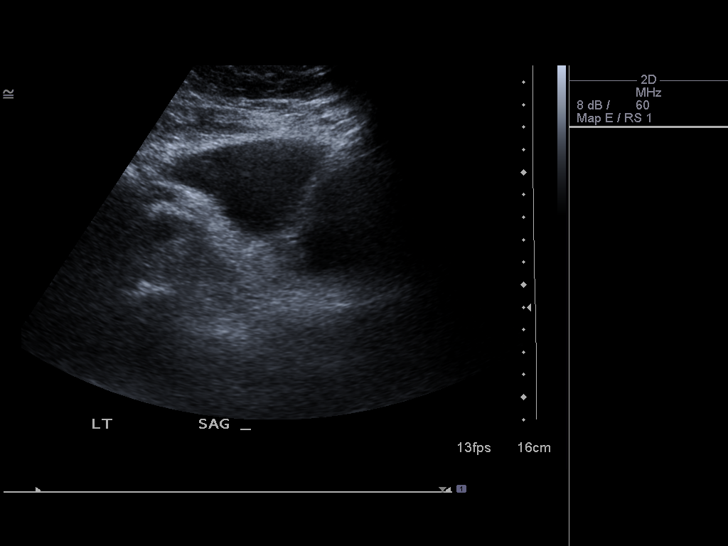
[im 34/68]
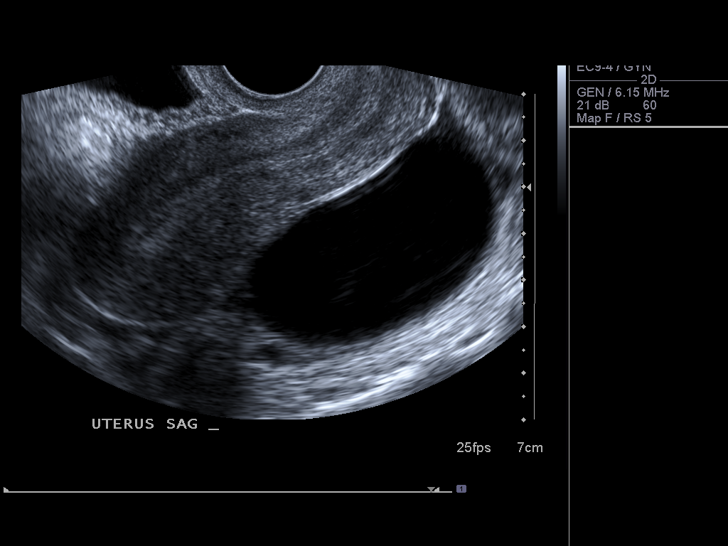
[im 40/68]
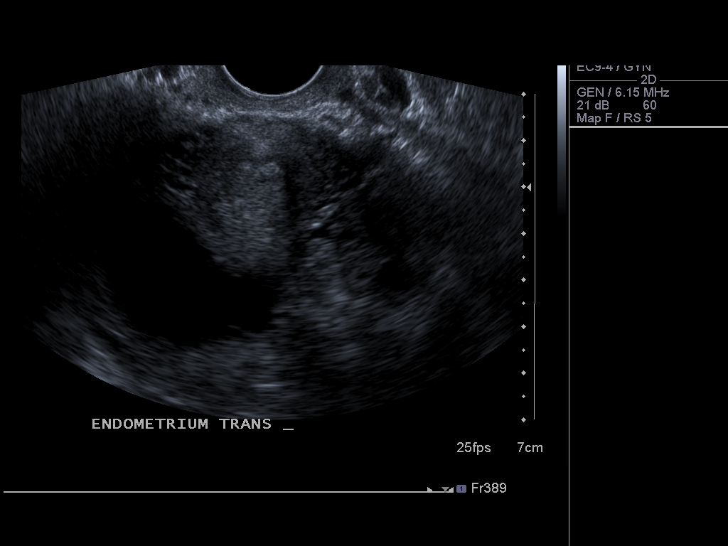
[im 45/68]
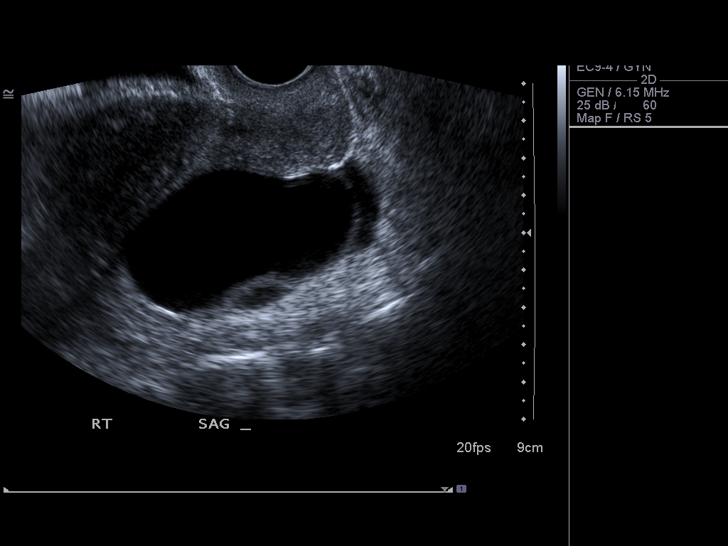
[im 51/68]
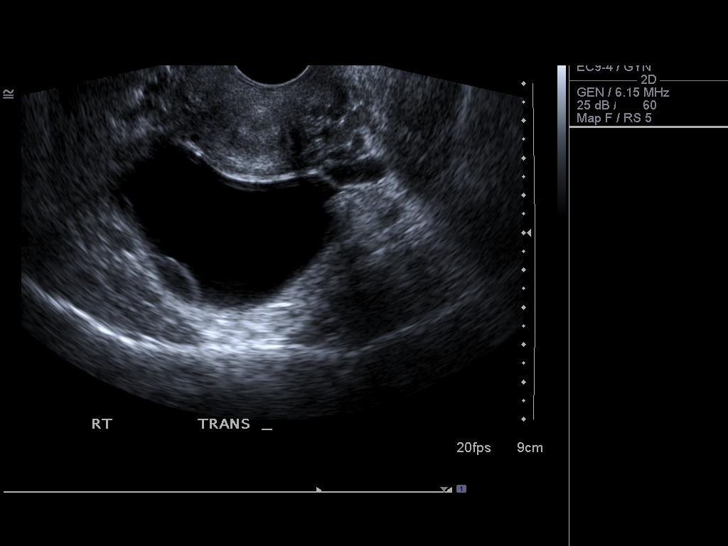
[im 56/68]
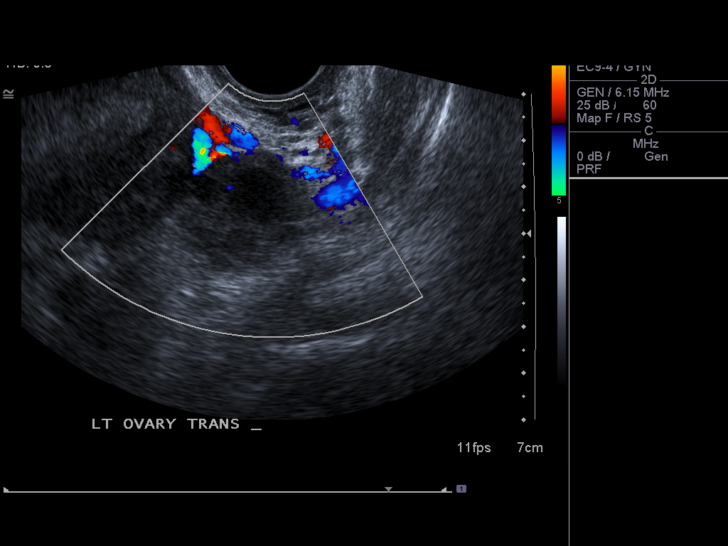
[im 62/68]
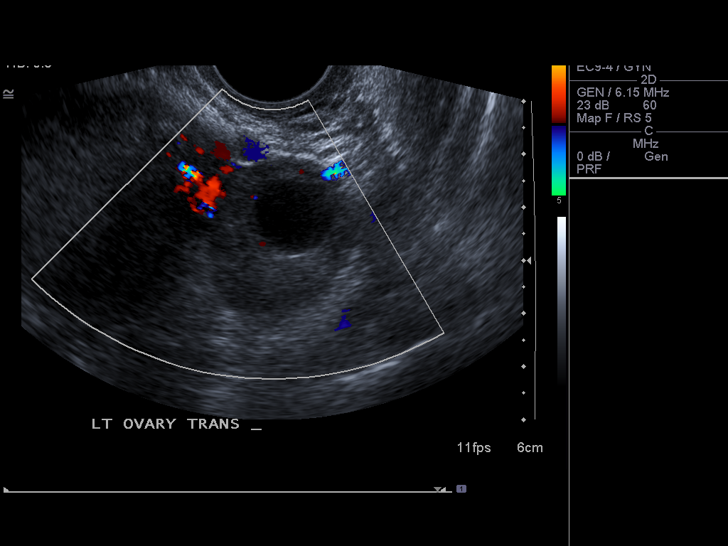
[im 68/68]
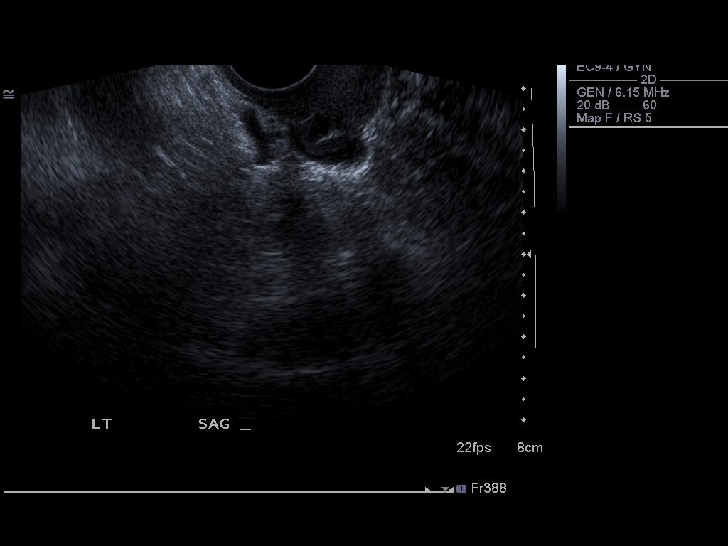

[13 of 25 positions shown; findings below may reference images not displayed]

FINDINGS: Uterus: 8.9 x 3.3 x 4.8 cm.  Normal in appearance.

Endometrium: 8 mm in thickness, normal in appearance.

Right ovary:  Within the right adnexal region, there is a 6.1 x
x 3.6 cm hypoechoic lesion with septation.  Posteriorly, there may
be a small solid component.  Ovarian origin is favored.

Left ovary: 3.4 x 2.7 x 2.7 cm.  There is a 1.6 cm follicle.

Other findings: No free fluid
IMPRESSION: 1.Normal appearance of the uterus and left ovary.
2.  Slightly septated right adnexal cyst, favoring ovarian origin.
Small posterior solid component is not entirely excluded.
Therefore, follow-up is suggested in 6-8 weeks to insure
resolution.  Findings are felt likely to be physiologic.  However,
follow-up will be helpful to exclude benign or malignant neoplasm.

## 2015-08-01 ENCOUNTER — Telehealth: Payer: Self-pay | Admitting: Obstetrics & Gynecology

## 2015-08-01 NOTE — Telephone Encounter (Signed)
Prior pt at our office called to report she's concerned her IUD in not in the correct position.  This was placed 3/15 with Dr. Charlies Constable while having laparoscopic tube and ovary removal.  Pt seeing Dr. Jeannie Fend in [place] (pt unsure of spelling and Care Everywhere reviewed without any records from this provider present in [facility]).    Pt reports that she had nausea and vomiting on Monday.  Since that time, she's had some RLQ pain that seems to have been worsening.  She reports that she saw Dr. Jess Barters this week and had an ultrasound done.   She was told that the IUD was in the correct location.    She reports she's still having some pelvic pain with associated leg pain.  She reports having this pain when she had a Mirena IUD several years ago.  Currently, she has a Insurance underwriter.  She is having some associated nausea as well.  no fever.  No vaginal discharge or odor.  She's been taking 400mg  Motrin.    Pt lives in [place] and does not want to go to the emergency room tonight for evaluation.  Advised she can increase Motrin to 800mg  every 8 hours as needed for pain.  Precautions given for going to local ER if pain worsens tonight.  Pt voices understanding.  She will plan to call local office tomorrow for appt.  Asked pt why she called me for advice instead of local provider.  States she called her local provider and there was a voicemail that stated if there was an emergency, she needed to be seen in the ER.  Since she was a prior patient of our practice and knew she would most likely be able to talk to a person, she decided to call.

## 2015-08-04 ENCOUNTER — Telehealth: Payer: Self-pay | Admitting: Obstetrics & Gynecology

## 2015-08-04 NOTE — Telephone Encounter (Signed)
Left message to call [facility] [place] at [phone]. Need to evaluate patient's pain with Sklya IUD. May need earlier appointment.

## 2015-08-04 NOTE — Telephone Encounter (Signed)
Patient spoke with Dr.Suzanne Sabra Heck (on call) regarding her pain with IUD. Patient says Dr.Miller told her she could call the office and schedule her aex and possible IUD exchange at the same appointment. Patient is scheduled for her aex 08/10/15 @ 1:30pm with Dr.Miller.

## 2015-08-05 MED ORDER — MISOPROSTOL 200 MCG PO TABS: ORAL_TABLET | ORAL | Status: DC

## 2015-08-05 NOTE — Telephone Encounter (Signed)
Patient returned call

## 2015-08-05 NOTE — Telephone Encounter (Signed)
Spoke with patient. Patient states that her pain has "gotten a lot better." Is still having discomfort when she lays down or is sitting. "It is not emergent any more. When I called her it was bad, but now I feel a lot better." Denies any fevers, chills, or sharp pain. States she was having nausea when she spoke with Dr.Miller, but that too has gone away. Offered to schedule patient an earlier appointment for evaluation with Dr.Miller, but she declines. Is unable to be seen before 1/10 as her boyfriend will be bringing her and she lives out of town. Patient states she spoke with Dr.Miller about having her IUD removed and replaced at her appointment. "Last time was a mess for me and it was bad. Dr.Miller said there are medications I can take before hand to help like Valium and other things. Should I have these before my appointment?" Advised I will speak with Dr.Miller and return call with further recommendations.

## 2015-08-05 NOTE — Telephone Encounter (Signed)
Spoke with patient. Advised of message as seen below from [place]. Patient is agreeable. Rx for Cytotec 200 mcg pv pm an am before #2 0RF sent to [facility] on file per patient request. Patient is aware she will need to arrive at the office at 12:30 pm on 08/10/2015 to sign her consent forms and receive Valium 1 mg. Aware she will need a driver home from the appointment.   Routing to provider for final review. Patient agreeable to disposition. Will close encounter.

## 2015-08-05 NOTE — Telephone Encounter (Signed)
She should use cytotec 260mcg pv pm and am before procedure and valium 1mg  1 hour before.  She will need to come early to sign consent form.  I can remove and replace IUD at same time at [facility].

## 2015-08-09 ENCOUNTER — Telehealth: Payer: Self-pay | Admitting: Obstetrics & Gynecology

## 2015-08-09 DIAGNOSIS — Z30433 Encounter for removal and reinsertion of intrauterine contraceptive device: Secondary | ICD-10-CM

## 2015-08-09 NOTE — Telephone Encounter (Signed)
Spoke with patient. Patient lives out of town and is unsure if she will be able to make it to her appointment tomorrow for aex and IUD removal/reinsertion. Patient would like to reschedule to be safe. Appointment rescheduled to 08/17/2015 at 3:30 pm with Dr.Miller. Patient is aware she will need to arrive to the office at 2:30 pm that day to sign consents prior to receiving pre medication. Pre procedure instructions given.  Motrin instructions given. Motrin=Advil=Ibuprofen, 800 mg one hour before appointment. Eat a meal and hydrate well before appointment. Cytotec instructions reviewed. Patient is agreeable.  Routing to provider for final review. Patient agreeable to disposition. Will close encounter.

## 2015-08-09 NOTE — Telephone Encounter (Signed)
Patient is scheduled for aex tomorrow but thinks she was going to have an iud removal/insertion procedure as well.

## 2015-08-10 ENCOUNTER — Ambulatory Visit: Payer: Self-pay | Admitting: Obstetrics & Gynecology

## 2015-08-17 ENCOUNTER — Ambulatory Visit (INDEPENDENT_AMBULATORY_CARE_PROVIDER_SITE_OTHER): Payer: BLUE CROSS/BLUE SHIELD | Admitting: Obstetrics & Gynecology

## 2015-08-17 ENCOUNTER — Encounter: Payer: Self-pay | Admitting: Obstetrics & Gynecology

## 2015-08-17 ENCOUNTER — Telehealth: Payer: Self-pay | Admitting: Obstetrics & Gynecology

## 2015-08-17 VITALS — BP 110/66 | HR 68 | Resp 16 | Ht 65.75 in | Wt 188.0 lb

## 2015-08-17 DIAGNOSIS — Z01419 Encounter for gynecological examination (general) (routine) without abnormal findings: Secondary | ICD-10-CM | POA: Diagnosis not present

## 2015-08-17 DIAGNOSIS — Z30433 Encounter for removal and reinsertion of intrauterine contraceptive device: Secondary | ICD-10-CM

## 2015-08-17 DIAGNOSIS — Z202 Contact with and (suspected) exposure to infections with a predominantly sexual mode of transmission: Secondary | ICD-10-CM

## 2015-08-17 DIAGNOSIS — Z30430 Encounter for insertion of intrauterine contraceptive device: Secondary | ICD-10-CM | POA: Diagnosis not present

## 2015-08-17 NOTE — Telephone Encounter (Signed)
Patient called and said, "I have a procedure this afternoon I am really nervous about. I am feeling nauseas but I haven't thrown up. I want to speak to the nurse for some advice."

## 2015-08-17 NOTE — Telephone Encounter (Signed)
Spoke with patient. Patient is scheduled for an IUD removal and reinsertion today at 3:30 pm with Dr.Miller (will arrive 1 hour early to sign consent and receive Valium 1mg ). Patient states that she ate something yesterday that did not agree with her stomach has been nauseous, but has not vomited. Denies fever, chills, or diarrhea. States she was not able to place the Cytotec last night due to not feeling well. Patient is asking if she needs to reschedule. Advised patient if she does not feel well we can reschedule her appointment. Advised based upon our previous conversations of her first IUD insertion Dr.Miller does recommend the use of Cytotec and Ibuprofen prior to her appointment. Patient would like to try to keep her appointment for today. Asking if she can place the Cytotec at this time or if it is too close to her appointment time. Advised I will speak with Dr.Miller and return call with further recommendations. Patient is agreeable.

## 2015-08-17 NOTE — Telephone Encounter (Signed)
She doesn't need to worry about the cytotec. I'll still do everything else that we discussed.

## 2015-08-17 NOTE — Progress Notes (Signed)
30 y.o. G0P0000 SingleCaucasianF here for annual exam.     Patient's last menstrual period was 08/15/2015.          Sexually active: Yes.    The current method of family planning is IUD.    Exercising: Yes.    walking, gym, cardio weights Smoker:  no  Health Maintenance: Pap:  1/16 at [facility] (reviewed in [place]) History of abnormal Pap:  no MMG:  never TDaP:  Over due. Not today  Screening Labs: PCP, Hb today: PCP, Urine today: PCP   reports that she has never smoked. She has never used smokeless tobacco. She reports that she drinks alcohol. She reports that she does not use illicit drugs.  Past Medical History  Diagnosis Date  . Factor 5 Leiden mutation, heterozygous ([place])   . Anxiety   . Clotting disorder ([place])   . Dysmenorrhea   . HSV-1 infection   . Blood dyscrasia     factor V  . Ovarian cyst   . DVT of leg (deep venous thrombosis) ([place])     left    Past Surgical History  Procedure Laterality Date  . Orif of right fib  2011    Open Reduction of the right fibula  . Appendectomy  1998  . Wisdom tooth extraction  2012  . Cystectomy Right 01/07/2013    Laparoscopic  . Laparoscopy Right 01/07/2013    Procedure: LAPAROSCOPY OPERATIVE;  Surgeon: Azalia Bilis, MD;  Location: [facility];  Service: Gynecology;  Laterality: Right;  ovarian cystectomy  . Intrauterine device (iud) insertion N/A 01/07/2013    Procedure: INTRAUTERINE DEVICE (IUD) INSERTION;  Surgeon: Azalia Bilis, MD;  Location: [facility];  Service: Gynecology;  Laterality: N/A;  . Laparoscopic ovarian cystectomy Right 09/28/2013    Procedure: LAPAROSCOPIC RIGHT OOPHORECTOMY;  Surgeon: Azalia Bilis, MD;  Location: [facility];  Service: Gynecology;  Laterality: Right;    Current Outpatient Prescriptions  Medication Sig Dispense Refill  . amphetamine-dextroamphetamine (ADDERALL) 20 MG tablet Take 30 mg by mouth daily.     . Levonorgestrel (SKYLA) 13.5 MG IUD by Intrauterine route.    . misoprostol (CYTOTEC) 200  MCG tablet Place 1 tablet vaginally the night before the procedure. Place 1 tablet vaginally the morning of the procedure. 2 tablet 0  . Omega-3 1000 MG CAPS Take by mouth.     No current facility-administered medications for this visit.    Family History  Problem Relation Age of Onset  . Hypertension Mother   . Clotting disorder Mother     DVT  . Cancer Maternal Grandmother     pancreatic  . Diabetes Maternal Grandmother   . Pancreatic cancer Maternal Grandmother   . Cancer Paternal Grandmother   . Breast cancer Paternal Grandmother   . Lung cancer Paternal Grandfather     ROS:  Pertinent items are noted in HPI.  Otherwise, a comprehensive ROS was negative.  Exam:   BP 110/66 mmHg  Pulse 68  Resp 16  Ht 5' 5.75" (1.67 m)  Wt 188 lb (85.276 kg)  BMI 30.58 kg/m2  LMP 08/15/2015   Height: 5' 5.75" (167 cm)  Ht Readings from Last 3 Encounters:  08/17/15 5' 5.75" (1.67 m)  08/18/14 5\' 6"  (1.676 m)  12/04/13 5\' 6"  (1.676 m)    General appearance: alert, cooperative and appears stated age Head: Normocephalic, without obvious abnormality, atraumatic Neck: no adenopathy, supple, symmetrical, trachea midline and thyroid normal to inspection and palpation Lungs: clear to auscultation  bilaterally Breasts: normal appearance, no masses or tenderness Heart: regular rate and rhythm Abdomen: soft, non-tender; bowel sounds normal; no masses,  no organomegaly Extremities: extremities normal, atraumatic, no cyanosis or edema Skin: Skin color, texture, turgor normal. No rashes or lesions Lymph nodes: Cervical, supraclavicular, and axillary nodes normal. No abnormal inguinal nodes palpated Neurologic: Grossly normal   Pelvic: External genitalia:  no lesions              Urethra:  normal appearing urethra with no masses, tenderness or lesions              Bartholins and Skenes: normal                 Vagina: normal appearing vagina with normal color and discharge, no lesions               Cervix: no lesions              Pap taken: No. Bimanual Exam:  Uterus:  normal size, contour, position, consistency, mobility, non-tender              Adnexa: normal adnexa and no mass, fullness, tenderness               Rectovaginal: Confirms               Anus:  normal sphincter tone, no lesions  Pt desires to proceed with IUD removal and replacement with [name] placement.  After patient read information booklet and all questions were answered, informed consent was obtained.     Procedure:  Speculum inserted into vagina. Cervix visualized and cleansed with betadine solution X 3. Paracervical block placed:  yes, 1% Lidocaine, Lot [id number], ep 7/17.  10cc's total used.  Tenaculum placed on cervix at 12 o'clock position.  Uterus sounded to 7 centimeters.  IUD and inserting device removed from sterile packet and under sterile conditions inserted to fundus of uterus.  IUD released and introducer removed without difficulty.  IUD string trimmed to 2 centimeters.  Remainder string given to patient to feel for identification.  Tenaculum removed.  Minimal bleeding noted.  Speculum removed.  Patient tolerated procedure well.  IUD Lot #:[id number]).  Exp: 3/18.  Package information attached to consent and scanned into EPIC.  Chaperone was present for exam.  A:  Well Woman with normal exam Removal of Skyla IUD and placement of [place] IUD H/O factor V Leiden's  H/O DVT H/O recurrent ovarian cysts, s/p laparoscopic RSO 09/2013 Anxiety STD testing requested  P:   Mammogram starting age 59 reviewed No pap obtained.  Could review Pap in Care Everywhere Removal of Skyla IUD today and placement of [place] IUD today.  Pt aware removal is due no later than 08/16/2020.  Recheck 6 weeks HIV, RPR, HepB ab testing.  Pt aware urine for GC/Chl discarded after procedure completed.  Will plan GC/Chl testing when she returns for follow up.  Has been with current partner over a year and had STD testing 11/15 that was all  negative. [facility] due 1 year or follow up PRN

## 2015-08-17 NOTE — Telephone Encounter (Signed)
Spoke with patient. Advised of message as seen below from [place]. Patient states that she already placed both Cytotec tablets. Advised this is okay. Advised she will need to take 800 mg of Ibuprofen/Motrin 1 hour before her procedure. Patient states she already took 800 mg of Ibuprofen due to cramping with her cycle. Patient will arrive one hour early to her appointment to sign her consent forms and receive premedication of Valium 1 mg. Aware she will need to bring a driver to her appointment.  Routing to provider for final review. Patient agreeable to disposition. Will close encounter.

## 2015-08-18 LAB — STD PANEL
HIV 1&2 Ab, 4th Generation: NONREACTIVE
Hepatitis B Surface Ag: NEGATIVE

## 2015-09-29 ENCOUNTER — Telehealth: Payer: Self-pay | Admitting: Obstetrics & Gynecology

## 2015-09-29 NOTE — Telephone Encounter (Signed)
Left patient a message to call back to reschedule a future appointment that was cancelled by the provider. Patient needs to reschedule her 6-8 week recheck that was cancelled for 10/08/15 with Dr. Sabra Heck.

## 2015-10-08 ENCOUNTER — Ambulatory Visit: Payer: BLUE CROSS/BLUE SHIELD | Admitting: Obstetrics & Gynecology

## 2015-10-15 ENCOUNTER — Ambulatory Visit (INDEPENDENT_AMBULATORY_CARE_PROVIDER_SITE_OTHER): Payer: BLUE CROSS/BLUE SHIELD | Admitting: Obstetrics & Gynecology

## 2015-10-15 VITALS — BP 98/60 | HR 92 | Resp 16 | Ht 65.75 in | Wt 179.0 lb

## 2015-10-15 DIAGNOSIS — D270 Benign neoplasm of right ovary: Secondary | ICD-10-CM

## 2015-10-15 DIAGNOSIS — Z30431 Encounter for routine checking of intrauterine contraceptive device: Secondary | ICD-10-CM | POA: Diagnosis not present

## 2015-10-15 DIAGNOSIS — Z202 Contact with and (suspected) exposure to infections with a predominantly sexual mode of transmission: Secondary | ICD-10-CM | POA: Diagnosis not present

## 2015-10-15 NOTE — Progress Notes (Signed)
Subjective:     Patient ID: Julie Doyle, female   DOB: 06-Mar-1986, 30 y.o.   MRN: [id number]  HPI 30 yo G0 SWF here for follow-up after have IUD placed.  At last visit, pt was to leave a urine sample for Gc/Chl testing.  This was not done as IUD placement in this pt is very difficult.  She is planning to do this today.  Pt has done well since IUD placement.  She is still spotting.  She hasn't really had a "normal cycle".  She did have some cramping for a few days post procedure but this is much better.  She did take some Motrin as needed.  Denies pain with intercourse.  Denies vaginal discharge or odor.  She has not tried to feel the strings.    Pt is aware her blood work form last visit was negative.  Last Pap 1/16 at [facility] and is located in [place].    Pt has qeustions today about her h/o dermoid and laparoscopic RSO 3/15.  She wonders about ultrasound on the other ovary.  D/W pt scenarios for repeating ultrasound and for possibility of dermoid in opposite ovary.  As well, she is aware if this happened, we would do everything possible to save a portion of the ovary for future child bearing desires.  Would involve REI as well.  Pt really comfortable with plan.  Will not plan PUS at this time but will discuss at [facility] in one year.  Review of Systems  Genitourinary: Positive for menstrual problem (spotting since Julie Doyle placement).  All other systems reviewed and are negative.      Objective:   Physical Exam  Constitutional: She is oriented to person, place, and time. She appears well-developed and well-nourished.  Abdominal: Bowel sounds are normal.  Genitourinary: Vagina normal and uterus normal. There is no rash, tenderness or lesion on the right labia. There is no rash, tenderness or lesion on the left labia. Cervix exhibits no motion tenderness, no discharge and no friability. Right adnexum displays no mass, no tenderness and no fullness. Left adnexum displays no mass, no tenderness  and no fullness.  2cm IUD string noted.  Lymphadenopathy:       Right: No inguinal adenopathy present.       Left: No inguinal adenopathy present.  Neurological: She is alert and oriented to person, place, and time.  Skin: Skin is warm and dry.  Psychiatric: She has a normal mood and affect.       Assessment:     Julie Doyle IUD placed 08/17/15 with normal exam today.  String is appropriate length. 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  Results will be communicated to the pt.  Follow up in one year or prn.  ~20 minutes spent with patient >50% of time was in face to face discussion of above.

## 2015-10-16 LAB — GC/CHLAMYDIA PROBE AMP
CT Probe RNA: NOT DETECTED
GC PROBE AMP APTIMA: NOT DETECTED

## 2015-10-29 ENCOUNTER — Encounter: Payer: Self-pay | Admitting: Obstetrics & Gynecology

## 2015-10-29 DIAGNOSIS — D270 Benign neoplasm of right ovary: Secondary | ICD-10-CM | POA: Insufficient documentation

## 2016-04-24 ENCOUNTER — Telehealth: Payer: Self-pay | Admitting: Obstetrics & Gynecology

## 2016-04-24 NOTE — Telephone Encounter (Signed)
Left message to call Gina Costilla at [phone].  

## 2016-04-24 NOTE — Telephone Encounter (Signed)
Patient is having ovarian pain that she is not sure is related to her cycle.

## 2016-04-25 NOTE — Telephone Encounter (Signed)
Patient is returning a call to Jill. °

## 2016-04-25 NOTE — Telephone Encounter (Signed)
Patient returning your call.

## 2016-04-25 NOTE — Telephone Encounter (Signed)
Left message to call back at [phone].

## 2016-04-25 NOTE — Telephone Encounter (Signed)
Spoke with patient. Patient states she has had left sided "ovarian" pain for the last 3 weeks. Patient describes as sharp, deep, pressure. Patient reports starting her cycle 04-21-16 with light bleeding. Patient states since starting her cycle the pain has felt more like period cramping and is relieved by Motrin. Patient has a [place] IUD in place, placed 08/17/15. 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 Patient states she thinks the pain is the same as she experienced with cyst on right ovary. Advised patient to come in for OV for further evaluation. Patient states she would like to let her cycle finish to see if the pain resolves. Patient would like to know if she will need an PUS? Patient states she lives in [place] and would like to make one OV with PUS, if needed. Advised patient would speak with Dr. Sabra Heck for further recommendations and return call.   Dr. Sabra Heck, please advise?

## 2016-04-25 NOTE — Telephone Encounter (Signed)
Ok to wait through cycle and if pain resolves, no OV needed.  If pain persists, ok to do OV and PUS same day.

## 2016-04-26 NOTE — Telephone Encounter (Signed)
Pt returning your call

## 2016-04-26 NOTE — Telephone Encounter (Signed)
Left message to return call to [place] at [phone].

## 2016-04-26 NOTE — Telephone Encounter (Signed)
Spoke with patient. Advised of Dr. Sanjuan Dame recommendations as seen below. Patient verbalizes understanding and is agreeable.   Routing to provider for final review. Patient is agreeable to disposition. Will close encounter.

## 2016-05-03 ENCOUNTER — Telehealth: Payer: Self-pay | Admitting: Obstetrics & Gynecology

## 2016-05-03 DIAGNOSIS — R102 Pelvic and perineal pain: Secondary | ICD-10-CM

## 2016-05-03 DIAGNOSIS — Z8742 Personal history of other diseases of the female genital tract: Secondary | ICD-10-CM

## 2016-05-03 NOTE — Telephone Encounter (Signed)
Spoke with patient. Patient states that for 1 month she has been experiencing intermittent pelvic discomfort and sharp pain. Reports pain is a 3-4/10 when it occurs. Denies any pain at this time. Patient has a [place] IUD. States she has never attempted to feel her IUD strings. Is having nausea without vomiting. Reports recent constipation. Patient is concerned she may have an ovarian cyst due to prior history of ovarian cyts. 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 Denies any fever, chills, bloating, or vaginal bleeding. Advised I will speak with covering provider and return call with further recommendations. Patient is agreeable.   Dr.Silva, should patient be seen in the office for PUS tomorrow with Dr.Miller?

## 2016-05-03 NOTE — Telephone Encounter (Signed)
I agree with plan for office visit and pelvic ultrasound with Dr. Sabra Heck.  To ER tonight if pain increases or nausea vomiting worsens.  Please have patient do a UPT if she is sexually active and report back if it is positive.  Cc- Dr. Sabra Heck

## 2016-05-03 NOTE — Telephone Encounter (Signed)
Spoke with patient. Advised of message as seen below from [place]. Patient is agreeable. Appointment scheduled for PUS tomorrow at 2 pm with 2:30 pm consult with Dr.Miller. Patient is agreeable to date and time. Order placed for precert.  Cc: Dr.Miller Lerry Liner  Routing to covering provider for final review. Patient agreeable to disposition. Will close encounter.

## 2016-05-03 NOTE — Telephone Encounter (Signed)
Patient is having pain in her left side and want to check to see if she has another cyst.

## 2016-05-04 ENCOUNTER — Ambulatory Visit (INDEPENDENT_AMBULATORY_CARE_PROVIDER_SITE_OTHER): Payer: BLUE CROSS/BLUE SHIELD | Admitting: Obstetrics & Gynecology

## 2016-05-04 ENCOUNTER — Ambulatory Visit (INDEPENDENT_AMBULATORY_CARE_PROVIDER_SITE_OTHER): Payer: BLUE CROSS/BLUE SHIELD

## 2016-05-04 DIAGNOSIS — T8332XA Displacement of intrauterine contraceptive device, initial encounter: Secondary | ICD-10-CM

## 2016-05-04 DIAGNOSIS — Z8742 Personal history of other diseases of the female genital tract: Secondary | ICD-10-CM | POA: Diagnosis not present

## 2016-05-04 DIAGNOSIS — Z30433 Encounter for removal and reinsertion of intrauterine contraceptive device: Secondary | ICD-10-CM

## 2016-05-04 DIAGNOSIS — R102 Pelvic and perineal pain: Secondary | ICD-10-CM | POA: Diagnosis not present

## 2016-05-04 MED ORDER — HYDROCODONE-ACETAMINOPHEN 5-325 MG PO TABS: ORAL_TABLET | ORAL | 0 refills | Status: DC

## 2016-05-04 MED ORDER — MISOPROSTOL 200 MCG PO TABS: ORAL_TABLET | ORAL | 0 refills | Status: DC

## 2016-05-04 MED ORDER — DIAZEPAM 5 MG PO TABS: ORAL_TABLET | ORAL | 0 refills | Status: DC

## 2016-05-04 NOTE — Progress Notes (Signed)
30 y.o. G0P0000 Single Caucasian female here for pelvic ultrasound due to new onset of pelvic pain.  Pt has [place] IUD and has done well with it.  This was placed 08/17/15.  Pt did have a lot of spotting after the IUD was placed but that has really improved.    Patient's last menstrual period was 04/23/2016.  Contraception: [name] IUD  Findings:  UTERUS: 8.3 x 4.3 x 3.9cm EMS: symmetrical endometrium without masses however IUD is displaced into lower uterine segemnt ADNEXA: Left ovary: 4.4 x 3.0 x 2.8cm with 2.4 x [id number] follicle       Right ovary: surgically absent CUL DE SAC: no free fluid  Discussion:  Findings reviewed with pt.  Due to displacement, IUD removal recommended.  Pt very tearful and anxious about this.  She does have some difficulty with gynecological exams/procedures.  She did eventualy give consent after sitting and talking for some time with pt about risks of leaving it in place--uterine perforation, continued pain, infection.    Procedure:  Speculum placed.  IUD string grasped with ringed forceps and removed with one pull.  Pt tolerated procedure well.  As pt does not have any other contraception than this and does not want to be on anything else, advised quick replacement of IUD would be best.  I'd like to make sure her pain resolves before placing another IUD.  Pt in agreement with this.  Planned replacement next week.  Will have pt use one Vicodin and Valium before she comes.  Consent was obtained today for procedure.  Assessment:  Pelvic pain in female Malpositioned IUD, removed today  Plan:  Plan made for replacement of IUD.  Rx for Vicodin and Valium given.  Consent for procedure obtained.    ~30 minutes spent with patient >50% of time was in face to face discussion of above in addition to removal of IUD.

## 2016-05-04 NOTE — Progress Notes (Signed)
After consult with Dr Sabra Heck, patient initially thought about option for IUD replacement at surgery center but then decided she felt that it was not worth additional time and expense. She requested to proceed with [facility] replacement in office, scheduled for Tuesday, 05-09-16 at 4pm. Patient signed consent today since will take pre-medication prior to appointment. She is aware to have someone drive her to and from appointment.

## 2016-05-08 ENCOUNTER — Telehealth: Payer: Self-pay | Admitting: Obstetrics & Gynecology

## 2016-05-08 NOTE — Telephone Encounter (Signed)
Spoke with patient. Patient is concerned because over the weekend she had to use the restroom in a "[place]" and is concerned about the risk for problems. Patient denies any current symptoms. Also reports she has been having mild cramping since her IUD was removed on 05/04/2016 and does not feel comfortable having her new IUD placed yet. Advised she will need to monitor her symptoms and if she has any worsening discomfort, develops and new symptoms, or has any concerns she will need to contact the office. Appointment for tomorrow cancelled. Patient will contact the office with the first day of her next menses to reschedule her IUD insertion.  Routing to provider for final review. Patient agreeable to disposition. Will close encounter.

## 2016-05-08 NOTE — Telephone Encounter (Signed)
Patient has a question for the nurse regarding her procedure tomorrow.

## 2016-05-09 ENCOUNTER — Other Ambulatory Visit: Payer: BLUE CROSS/BLUE SHIELD

## 2016-05-09 ENCOUNTER — Other Ambulatory Visit: Payer: BLUE CROSS/BLUE SHIELD | Admitting: Obstetrics & Gynecology

## 2016-05-11 ENCOUNTER — Encounter: Payer: Self-pay | Admitting: Obstetrics & Gynecology

## 2016-05-18 ENCOUNTER — Telehealth: Payer: Self-pay | Admitting: Obstetrics & Gynecology

## 2016-05-18 DIAGNOSIS — Z30431 Encounter for routine checking of intrauterine contraceptive device: Secondary | ICD-10-CM

## 2016-05-18 DIAGNOSIS — Z30430 Encounter for insertion of intrauterine contraceptive device: Secondary | ICD-10-CM

## 2016-05-18 NOTE — Telephone Encounter (Signed)
Patient calling to schedule iud insertion. °

## 2016-05-19 NOTE — Telephone Encounter (Signed)
Spoke with patient. Patient states that she started her menses yesterday 05/18/2016 and would like to schedule her [name] insertion. Appointment scheduled for 05/23/2016 at 1 pm with 1:30 pm appointment time with Dr.Miller. Patient will have someone drive her to and from her appointment as she has been prescribed pre medication by Dr.Miller to take before her IUD insertion. Aware she may take 1 tablet of Valium 5 mg and 1 tablet of Norco 5-325 mg 1 hour before her appointment with food. Patient is agreeable. Consent form was signed at her appointment on 05/04/2016 with Dr.Miller.  Routing to provider for final review. Patient agreeable to disposition. Will close encounter.

## 2016-05-19 NOTE — Telephone Encounter (Signed)
Attempted to reach patient at number provided [phone], there was no answer and recording states that the voicemail box is full and is not accepting new messages at this time.

## 2016-05-23 ENCOUNTER — Ambulatory Visit (INDEPENDENT_AMBULATORY_CARE_PROVIDER_SITE_OTHER): Payer: BLUE CROSS/BLUE SHIELD

## 2016-05-23 ENCOUNTER — Ambulatory Visit (INDEPENDENT_AMBULATORY_CARE_PROVIDER_SITE_OTHER): Payer: BLUE CROSS/BLUE SHIELD | Admitting: Obstetrics & Gynecology

## 2016-05-23 VITALS — BP 110/68 | HR 84 | Resp 14 | Wt 173.0 lb

## 2016-05-23 DIAGNOSIS — Z30430 Encounter for insertion of intrauterine contraceptive device: Secondary | ICD-10-CM

## 2016-05-23 DIAGNOSIS — Z30431 Encounter for routine checking of intrauterine contraceptive device: Secondary | ICD-10-CM | POA: Diagnosis not present

## 2016-05-23 DIAGNOSIS — D6851 Activated protein C resistance: Secondary | ICD-10-CM

## 2016-05-23 DIAGNOSIS — Z309 Encounter for contraceptive management, unspecified: Secondary | ICD-10-CM

## 2016-05-23 DIAGNOSIS — F411 Generalized anxiety disorder: Secondary | ICD-10-CM

## 2016-05-23 MED ORDER — DIAZEPAM 5 MG PO TABS: ORAL_TABLET | ORAL | 0 refills | Status: DC

## 2016-05-23 MED FILL — diazePAM 5 MG TABS: 5 | 5 days supply | Qty: 5 | Fill #0

## 2016-05-23 NOTE — Progress Notes (Signed)
30 y.o. G0P0000 Single Caucasian female presents for insertion of [place] IUD with ultrasound guidance due to malpositioned IUD that needed to be removed on 05/04/16.  Pt was advised not to be SA.  Reports they've been having oral sex but semen did "get on her" about 10 days ago.  had no vaginal penetration during the time since IUD removal.   Pt has been counseled about alternative forms of contraception including OCPs, progesterone options, condoms, and natural family planning.  She feels IUD is the best option for her.  Pt has also been counseled about risks and benefits as well as complications.  Consent is obtained today.  All questions answered prior to start of procedure.    Current contraception:  none  LMP:  Patient's last menstrual period was 04/23/2016.  Patient Active Problem List   Diagnosis Date Noted  . 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 DVT (deep venous thrombosis) ([place]) 10/07/2013  . Anxiety state, unspecified 11/12/2012  . Reactive depression (situational) 11/12/2012  . Dysmenorrhea 11/12/2012  . Insomnia 11/12/2012  . TMJ (dislocation of temporomandibular joint) 11/12/2012  . Factor 5 Leiden mutation, heterozygous [facility])    Past Medical History:  Diagnosis Date  . Anxiety   . Cystic teratoma of right ovary 3/15   RSO  . DVT of leg (deep venous thrombosis) ([facility])    left  . Dysmenorrhea   . Factor 5 Leiden mutation, heterozygous ([place])   . HSV-1 infection    Current Outpatient Prescriptions on File Prior to Visit  Medication Sig Dispense Refill  . amphetamine-dextroamphetamine (ADDERALL) 20 MG tablet Take 30 mg by mouth daily.     Marland Kitchen aspirin 81 MG tablet Take 81 mg by mouth daily.    Marland Kitchen buPROPion (WELLBUTRIN SR) 150 MG 12 hr tablet Take 150 mg by mouth daily.    Marland Kitchen HYDROcodone-acetaminophen (NORCO/VICODIN) 5-325 MG tablet Take 2 hours before procedure. 6 tablet 0  . lisdexamfetamine (VYVANSE) 20 MG capsule Take 20 mg by mouth daily.    . misoprostol (CYTOTEC) 200  MCG tablet 1 tab pv pm and am before procedure 2 tablet 0  . Omega-3 1000 MG CAPS Take by mouth.     No current facility-administered medications on file prior to visit.    Ivp dye [iodinated diagnostic agents] and Phenergan [promethazine hcl]  ROS Vitals:   05/23/16 1415  BP: 110/68  Pulse: 84  Resp: 14  Weight: 173 lb (78.5 kg)    Gen:  WNWF healthy female NAD Abdomen: soft, non-tender Groin:  no inguinal nodes palpated  Pelvic exam: Vulva:  normal female genitalia Vagina:  normal vagina Cervix:  Non-tender, Negative CMT, no lesions or redness. Uterus:  normal shape, position and consistency   Procedure:  Speculum reinserted.  Cervix visualized and cleansed with Betadine x 3.  Paracervical block was placed.  Single toothed tenaculum applied to anterior lip of cervix without difficulty.  1% Lidocaine without epinephrine was used.  10cc total instilled at 3 and 9 o'clock positions on cervix.  Uterus sounded to 7.5cm.  Lot number: [id number].  Expiration:  2/19.  IUD package was opened.  IUD and introducer passed to fundus and then withdrawn slightly before IUD was passed into endometrial cavity.  Introducer removed.  Strings cut to 2cm.  Tenaculum removed from cervix.  Minimal bleeding noted.  Pt tolerated the procedure well.  All instruments removed from vagina.  A: Insertion of [name] IUD due to prior malpositioned IUD Contraception desires H/O Factor V Leiden, heterozygous  state H/O DVT following surgery on coumadin x 3 months.  Followed by hematology/oncology at [facility].  Notes reviewed.  P:  Return for recheck 8 weeks Pt aware to call for any concerns Rx for Valium 5mg  prn anxiety.  #5/0RF Pt aware removal due no later than 05/23/2021.  IUD card given to pt.  Reminded pt and her mother about MRI conditional state of [place] IUD.

## 2016-05-24 ENCOUNTER — Encounter: Payer: Self-pay | Admitting: Obstetrics & Gynecology

## 2016-06-30 HISTORY — PX: WISDOM TOOTH EXTRACTION: SHX21

## 2016-07-27 ENCOUNTER — Ambulatory Visit (INDEPENDENT_AMBULATORY_CARE_PROVIDER_SITE_OTHER): Payer: BLUE CROSS/BLUE SHIELD | Admitting: Obstetrics & Gynecology

## 2016-07-27 ENCOUNTER — Encounter: Payer: Self-pay | Admitting: Obstetrics & Gynecology

## 2016-07-27 VITALS — BP 104/50 | HR 78 | Resp 14 | Ht 65.75 in | Wt 174.2 lb

## 2016-07-27 DIAGNOSIS — N644 Mastodynia: Secondary | ICD-10-CM

## 2016-07-27 DIAGNOSIS — Z30431 Encounter for routine checking of intrauterine contraceptive device: Secondary | ICD-10-CM | POA: Diagnosis not present

## 2016-07-27 NOTE — Progress Notes (Signed)
30 y.o. G0P0000 Single Caucasian female presents for followed up after insertion of [name] IUD on 05/23/16.  Pt reports she is doing "fairly well".  Reports most recent cycle was 3-4 days and light.  She did have a fair amount of cramping after the IUD placement.  She is currently very pleased with her choice.  She has not tried to feel her strings.  She reports she is having some breast tenderness.  It is always on the left side when it occurs.  She feels the tenderness has been present for about two months.  No recent breast trauma and no nipple tenderness.  Drinks a cup of coffee daily.  Sometimes she has more than a cup but this is not "typical".   LMP:  Patient's last menstrual period was 07/14/2016.  Patient Active Problem List   Diagnosis Date Noted  . 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 DVT (deep venous thrombosis) ([place]) 10/07/2013    Class: History of  . Generalized anxiety disorder 11/12/2012  . Reactive depression (situational) 11/12/2012  . Dysmenorrhea 11/12/2012  . Insomnia 11/12/2012  . TMJ (dislocation of temporomandibular joint) 11/12/2012  . Factor 5 Leiden mutation, heterozygous [facility])    Past Medical History:  Diagnosis Date  . Anxiety   . Cystic teratoma of right ovary 3/15   RSO  . DVT of leg (deep venous thrombosis) ([facility])    left  . Dysmenorrhea   . Factor 5 Leiden mutation, heterozygous ([place])   . HSV-1 infection    Current Outpatient Prescriptions on File Prior to Visit  Medication Sig Dispense Refill  . amphetamine-dextroamphetamine (ADDERALL) 20 MG tablet Take 30 mg by mouth daily.     Marland Kitchen aspirin 81 MG tablet Take 81 mg by mouth daily.    Marland Kitchen buPROPion (WELLBUTRIN SR) 150 MG 12 hr tablet Take 150 mg by mouth daily.     No current facility-administered medications on file prior to visit.    Ivp dye [iodinated diagnostic agents] and Phenergan [promethazine hcl]  Review of Systems  All other systems reviewed and are negative.    Vitals:   07/27/16  1402  BP: (!) 104/50  Pulse: 78  Resp: 14  Weight: 174 lb 3.2 oz (79 kg)  Height: 5' 5.75" (1.67 m)   Physical Exam  Constitutional: She is oriented to person, place, and time. She appears well-developed and well-nourished.  Pulmonary/Chest: Right breast exhibits tenderness (RUOQ tenderness, no focal findings). Right breast exhibits no inverted nipple, no mass, no nipple discharge and no skin change. Left breast exhibits tenderness (LUOQ tenderness, no focal findings). Left breast exhibits no inverted nipple, no mass, no nipple discharge and no skin change. Breasts are symmetrical.  Abdominal: Soft. Bowel sounds are normal.  Genitourinary: Vagina normal. There is no rash, tenderness, lesion or injury on the right labia. There is no rash, tenderness, lesion or injury on the left labia.  Genitourinary Comments: 2cm IUD string noted, no CMT  Lymphadenopathy:    She has no cervical adenopathy.    She has no axillary adenopathy.       Right: No inguinal adenopathy present.       Left: No inguinal adenopathy present.  Neurological: She is alert and oriented to person, place, and time.  Skin: Skin is warm and dry.  Psychiatric: She has a normal mood and affect.    A: IUD follow-up after insertion of [name] IUD on 05/23/16 Breast tenderness, no focal findings  P:  Pt will follow up for  AEX Decreased caffeine and Vit E suggested for breast pain.  Do not feel imaging is needed at this time.  Pt comfortable with plan but knows to call if has any new breast changes.

## 2016-08-28 ENCOUNTER — Telehealth: Payer: Self-pay | Admitting: Obstetrics & Gynecology

## 2016-08-28 NOTE — Telephone Encounter (Signed)
Left message to call Deiontae Rabel at [phone].  

## 2016-08-28 NOTE — Telephone Encounter (Signed)
Spoke with patient. Patient states she had [name] IUD placed 05/23/2016. Patient states she is currently on cycle, started 1/26. Patient reports cramping is worse on cycle and worse this month. Patient states she thinks IUD has moved because this has happened before. Patient reports on and off pain for the last 3 weeks. Patient states she is able to feel IUD strings today. Patient states since she has had an IUD that has moved before, she is sure this is what it hurts more this time. Recommended OV with Dr. Sabra Heck for further evaluation, patient declined at this time. Patient states she lives in [place] and with her new health insurance Dr. Sabra Heck is out-of-network. Patient would like to know if she should have IUD removed, now or later? Or PUS? Patient states she would then wait a couple of months for reinsertion. Patient states she has contacted an OB/GYN close to her in [place], but has not heard back yet for possible scheduling. Patient asking if Dr. Sabra Heck could let her know what would be best given her history? Advised patient Dr. Sabra Heck is in surgery this morning, response may not be immediate, patient is agreeable.  Dr. Sabra Heck -please advise?

## 2016-08-28 NOTE — Telephone Encounter (Signed)
Patient thinks her IUD has moved again states that she can feel the strings.  Patient also states she is having some pain.

## 2016-08-28 NOTE — Telephone Encounter (Signed)
Spoke with patient, advised as seen below per Dr. Sabra Heck. Patient states she was actually able to get in to be seen this morning by OB/GYN that she had previously seen in [facility]. Patient states she had PUS done and was advised IUD is in place. Patient states was recommended to have out patient pelvic physical  therapy. Patient reports hopes to start PT mid February. Patient thankful for return call. Advised patient will update Dr. Sabra Heck. Patient verbalizes understanding and is agreeable.  Routing to provider for final review. Patient is agreeable to disposition. Will close encounter.

## 2016-08-28 NOTE — Telephone Encounter (Signed)
I would recommend PUS first to make sure IUD has indeed moved before scheduling removal.  OK to see if can schedule in facility closer to her.  Thanks.

## 2017-08-08 ENCOUNTER — Telehealth: Payer: Self-pay | Admitting: Obstetrics & Gynecology

## 2017-08-08 NOTE — Telephone Encounter (Signed)
Patient called and left a message at lunch cancelling her AEX with Dr. Sabra Heck on 08/10/17. She reported she's moved out of the area to [place] and will continue care there. I did not enter an 02 recall. I returned the patient's call and left a message confirming the cancellation.  Routing to provider for FYI only.

## 2017-08-09 ENCOUNTER — Telehealth: Payer: Self-pay | Admitting: Obstetrics & Gynecology

## 2017-08-09 NOTE — Telephone Encounter (Signed)
Called and spoke with relay the following message from Dr. Sabra Heck. The patient told me she has new GYN in [place] and she was seen there about two months ago. The patient then requested to speak with the nurse about when her IUD will need replaced?  Could you please call her and remind her that her last pap (per my records was 1/16) and she does need one now so please make sure to have this scheduled, if she has not already done so.  Thanks.      Documentation     You routed conversation to Megan Salon, MD Yesterday (1:40 PM)    You  Kurtz, Lanae Crumbly Yesterday (1:40 PM)    You Yesterday (1:37 PM)      Patient called and left a message at lunch cancelling her AEX with Dr. Sabra Heck on 08/10/17. She reported she's moved out of the area to [place] and will continue care there. I did not enter an 02 recall. I returned the patient's call and left a message confirming the cancellation.  Routing to provider for FYI only.      Documentation

## 2017-08-09 NOTE — Telephone Encounter (Signed)
Could you please call her and remind her that her last pap (per my records was 1/16) and she does need one now so please make sure to have this scheduled, if she has not already done so.  Thanks.

## 2017-08-09 NOTE — Telephone Encounter (Signed)
Spoke with patient. [name] IUD placed 08/17/15, due to be replaced no later than 08/16/2020. Patient verbalizes understanding and is agreeable.   Routing to provider for final review. Patient is agreeable to disposition. Will close encounter.  Cc: Julie Doyle

## 2017-08-13 ENCOUNTER — Ambulatory Visit: Payer: Self-pay | Admitting: Obstetrics & Gynecology
